# Patient Record
Sex: Male | Born: 1995 | Race: White | Hispanic: No | Marital: Single | Smoking: Never smoker
Health system: Southern US, Community
[De-identification: ages and names within clinical notes are randomized; demographics above are authoritative.]

## PROBLEM LIST (undated history)

## (undated) DIAGNOSIS — F419 Anxiety disorder, unspecified: Secondary | ICD-10-CM

---

## 2015-11-22 ENCOUNTER — Encounter: Payer: Self-pay | Admitting: Emergency Medicine

## 2015-11-22 ENCOUNTER — Emergency Department: Payer: PRIVATE HEALTH INSURANCE

## 2015-11-22 ENCOUNTER — Emergency Department
Admission: EM | Admit: 2015-11-22 | Discharge: 2015-11-22 | Disposition: A | Discharge status: Home / Self Care | Payer: PRIVATE HEALTH INSURANCE | Attending: Emergency Medicine | Admitting: Emergency Medicine

## 2015-11-22 DIAGNOSIS — J181 Lobar pneumonia, unspecified organism: Secondary | ICD-10-CM

## 2015-11-22 DIAGNOSIS — J189 Pneumonia, unspecified organism: Secondary | ICD-10-CM | POA: Diagnosis not present

## 2015-11-22 DIAGNOSIS — R05 Cough: Secondary | ICD-10-CM | POA: Diagnosis present

## 2015-11-22 MED ORDER — HYDROCOD POLST-CPM POLST ER 10-8 MG/5ML PO SUER: 5.0000 mL | Freq: Two times a day (BID) | ORAL | Status: AC | PRN

## 2015-11-22 MED ORDER — ALBUTEROL SULFATE HFA 108 (90 BASE) MCG/ACT IN AERS: 2.0000 | INHALATION_SPRAY | Freq: Four times a day (QID) | RESPIRATORY_TRACT | Status: AC | PRN

## 2015-11-22 MED ORDER — IBUPROFEN 600 MG PO TABS
600.0000 mg | ORAL_TABLET | Freq: Once | ORAL | Status: AC
  Administered 2015-11-22: 600 mg via ORAL
  Filled 2015-11-22: qty 1

## 2015-11-22 MED ORDER — AZITHROMYCIN 250 MG PO TABS: ORAL_TABLET | ORAL | Status: AC

## 2015-11-22 MED ORDER — AZITHROMYCIN 250 MG PO TABS
500.0000 mg | ORAL_TABLET | Freq: Once | ORAL | Status: AC
  Administered 2015-11-22: 500 mg via ORAL
  Filled 2015-11-22: qty 2

## 2015-11-22 MED ORDER — IPRATROPIUM-ALBUTEROL 0.5-2.5 (3) MG/3ML IN SOLN
3.0000 mL | Freq: Once | RESPIRATORY_TRACT | Status: AC
  Administered 2015-11-22: 3 mL via RESPIRATORY_TRACT
  Filled 2015-11-22: qty 3

## 2015-11-22 MED ORDER — BENZONATATE 100 MG PO CAPS
100.0000 mg | ORAL_CAPSULE | Freq: Once | ORAL | Status: AC
  Administered 2015-11-22: 100 mg via ORAL
  Filled 2015-11-22: qty 1

## 2015-11-22 NOTE — ED Provider Notes (Signed)
[facility] Emergency Department Provider Note  ____________________________________________  Time seen: Approximately 0032 AM  I have reviewed the triage vital signs and the nursing notes.   HISTORY  Chief Complaint Cough    HPI Dale BordersJacob Bartram Smith is a 19 y.o. male who comes in today with a cough. The patient reports that he has been sick since last Sunday with a viral infection. He reports that he's been coughing really bad in tonight he was seeing stars and feeling like he was going to black out from the coughing. The patient reports that it was really scary. He has had decreased appetite and vomited twice due to coughing. The patient has been drinking water but has not had much of an appetite. The patient saw his primary care physician 6 days ago and was told that it was just a viral illness. He had a flu test which was negative. He has been taking DayQuil, NyQuil and Robitussin but has not been helping. The patient was concerned so he decided to come in for evaluation. The patient reports he's had fevers with the highest being 102.9 5 days ago. He reports that today he feels flushed. He has been drinkingwater to stay hydrated. The patient was concerned due to how severe he was coughing so he decided to come in for evaluation.   History reviewed. No pertinent past medical history.  There are no active problems to display for this patient.   History reviewed. No pertinent past surgical history.  No current outpatient prescriptions on file.  Allergies Review of patient's allergies indicates no known allergies.  No family history on file.  Social History Social History  Substance Use Topics  . Smoking status: Never Smoker   . Smokeless tobacco: None  . Alcohol Use: No    Review of Systems Constitutional:  fever/chills Eyes: No visual changes. ENT: No sore throat. Cardiovascular: Denies chest pain. Respiratory: Cough Gastrointestinal: No  abdominal pain.  No nausea, no vomiting.  No diarrhea.  No constipation. Genitourinary: Negative for dysuria. Musculoskeletal: Negative for back pain. Skin: Negative for rash. Neurological: Negative for headaches, focal weakness or numbness.  10-point ROS otherwise negative.  ____________________________________________   PHYSICAL EXAM:  VITAL SIGNS: ED Triage Vitals  Enc Vitals Group     BP 11/22/15 0019 134/67 mmHg     Pulse Rate 11/22/15 0019 110     Resp 11/22/15 0019 18     Temp 11/22/15 0019 99.1 F (37.3 C)     Temp Source 11/22/15 0019 Oral     SpO2 11/22/15 0019 97 %     Weight 11/22/15 0019 175 lb (79.379 kg)     Height 11/22/15 0019 6' (1.829 m)     Head Cir --      Peak Flow --      Pain Score 11/22/15 0035 0     Pain Loc --      Pain Edu? --      Excl. in GC? --     Constitutional: Alert and oriented. Well appearing and in mild distress. Eyes: Conjunctivae are normal. PERRL. EOMI. Head: Atraumatic. Nose: No congestion/rhinnorhea. Mouth/Throat: Mucous membranes are moist.  Oropharynx non-erythematous. Cardiovascular: Normal rate, regular rhythm. Grossly normal heart sounds.  Good peripheral circulation. Respiratory: Normal respiratory effort.  No retractions. Lungs CTAB. Gastrointestinal: Soft and nontender. No distention.  Musculoskeletal: No lower extremity tenderness nor edema.   Neurologic:  Normal speech and language.  Skin:  Skin is warm, dry and intact.  Psychiatric:  Mood and affect are normal.   ____________________________________________   LABS (all labs ordered are listed, but only abnormal results are displayed)  Labs Reviewed - No data to display ____________________________________________  EKG  none ____________________________________________  RADIOLOGY  CXR: Left upper lobe pneumonia ____________________________________________   PROCEDURES  Procedure(s) performed: None  Critical Care performed:  No  ____________________________________________   INITIAL IMPRESSION / ASSESSMENT AND PLAN / ED COURSE  Pertinent labs & imaging results that were available during my care of the patient were reviewed by me and considered in my medical decision making (see chart for details).  This is a 19 year old male who comes in with coughing and fever for over a week. He reports that he has been severely coughing and is concerned due to the severity. The patient had some clear but coarse breath sounds elected give him a DuoNeb to see if that would help his coughing. The patient some benzonatate for his symptoms. It appears though that the patient does have a pneumonia. He is not febrile here and he is well appearing in no acute distress. I do not feel the patient needs any blood work at this time but I will treat him with azithromycin for his pneumonia. 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 I have explained return precautions and the patient seems to understand reasons to return to the emergency department. ____________________________________________   FINAL CLINICAL IMPRESSION(S) / ED DIAGNOSES  Final diagnoses:  Left upper lobe pneumonia      Rebecka Apley, MD 11/22/15 [phone]

## 2015-11-22 NOTE — ED Notes (Addendum)
Patient ambulatory to triage with steady gait, without difficulty or distress noted; pt reports since Sunday having prod cough yellow sputum; reports being seen by his PCP over Thanksgiving break and had negative flu swab

## 2015-11-22 NOTE — Discharge Instructions (Signed)

## 2016-11-27 ENCOUNTER — Encounter: Payer: Self-pay | Admitting: Emergency Medicine

## 2016-11-27 ENCOUNTER — Emergency Department
Admission: EM | Admit: 2016-11-27 | Discharge: 2016-11-27 | Disposition: A | Discharge status: Home / Self Care | Payer: PRIVATE HEALTH INSURANCE | Attending: Emergency Medicine | Admitting: Emergency Medicine

## 2016-11-27 DIAGNOSIS — Z79899 Other long term (current) drug therapy: Secondary | ICD-10-CM | POA: Insufficient documentation

## 2016-11-27 DIAGNOSIS — F419 Anxiety disorder, unspecified: Secondary | ICD-10-CM | POA: Diagnosis not present

## 2016-11-27 DIAGNOSIS — R202 Paresthesia of skin: Secondary | ICD-10-CM

## 2016-11-27 DIAGNOSIS — J340 Abscess, furuncle and carbuncle of nose: Secondary | ICD-10-CM | POA: Insufficient documentation

## 2016-11-27 DIAGNOSIS — L0291 Cutaneous abscess, unspecified: Secondary | ICD-10-CM

## 2016-11-27 HISTORY — DX: Anxiety disorder, unspecified: F41.9

## 2016-11-27 LAB — BASIC METABOLIC PANEL
Anion gap: 7 (ref 5–15)
BUN: 15 mg/dL (ref 6–20)
CALCIUM: 9.8 mg/dL (ref 8.9–10.3)
CO2: 27 mmol/L (ref 22–32)
CREATININE: 1.12 mg/dL (ref 0.61–1.24)
Chloride: 105 mmol/L (ref 101–111)
Glucose, Bld: 113 mg/dL — ABNORMAL HIGH (ref 65–99)
Potassium: 4 mmol/L (ref 3.5–5.1)
SODIUM: 139 mmol/L (ref 135–145)

## 2016-11-27 LAB — URINALYSIS, COMPLETE (UACMP) WITH MICROSCOPIC
BILIRUBIN URINE: NEGATIVE
GLUCOSE, UA: NEGATIVE mg/dL
HGB URINE DIPSTICK: NEGATIVE
Ketones, ur: NEGATIVE mg/dL
Leukocytes, UA: NEGATIVE
NITRITE: NEGATIVE
PROTEIN: NEGATIVE mg/dL
SPECIFIC GRAVITY, URINE: 1.025 (ref 1.005–1.030)
pH: 7.5 (ref 5.0–8.0)

## 2016-11-27 LAB — CBC
HCT: 43.8 % (ref 40.0–52.0)
HEMOGLOBIN: 15.4 g/dL (ref 13.0–18.0)
MCH: 31.4 pg (ref 26.0–34.0)
MCHC: 35 g/dL (ref 32.0–36.0)
MCV: 89.7 fL (ref 80.0–100.0)
PLATELETS: 195 10*3/uL (ref 150–440)
RBC: 4.88 MIL/uL (ref 4.40–5.90)
RDW: 12.7 % (ref 11.5–14.5)
WBC: 5.4 10*3/uL (ref 3.8–10.6)

## 2016-11-27 MED ORDER — LORAZEPAM 0.5 MG PO TABS: 0.5000 mg | ORAL_TABLET | Freq: Three times a day (TID) | ORAL | 0 refills | Status: AC | PRN

## 2016-11-27 NOTE — Discharge Instructions (Signed)
As we have discussed your workup today has shown very normal results. Please continue your antibiotic for its entire prescribe course. You'll be prescribed an anxiety medication called lorazepam. As we discussed this medication should only be taken when needed. Do not drink alcohol or drive fall taking this medication. Please follow-up with your primary care doctor/student health in approximately 3-5 days for recheck/reevaluation. Return to the emergency department for any fever or any other personally concerning symptoms.

## 2016-11-27 NOTE — ED Provider Notes (Signed)
[facility] Emergency Department Provider Note  Time seen: 2:32 PM  I have reviewed the triage vital signs and the nursing notes.   HISTORY  Chief Complaint Headache    HPI Dale Smith is a 20 y.o. male with a past medical history of anxiety who presents the emergency department for a nasal abscess and facial tingling. According to the patient approximately 10 days ago he was diagnosed with a nasal abscess by his primary care doctor prescribed Keflex. Patient states he lost his Keflex when traveling for Thanksgiving break. He went to an urgent care several days ago and was prescribed Bactrim. States he has been taking Bactrim but he has noted over last several days intermittent nausea, denies vomiting. Denies fever. Patient admits to significant history of anxiety. States today he was feeling very anxious and began feeling a tingling sensation in his hands and in his left face which concerned him. Denies any focal weakness. States he tried to calm himself down but the facial tingling did not resolve though he came to the emergency department for evaluation.  Past Medical History:  Diagnosis Date  . Anxiety     There are no active problems to display for this patient.   History reviewed. No pertinent surgical history.  Prior to Admission medications   Medication Sig Start Date End Date Taking? Authorizing Provider  albuterol (PROVENTIL HFA;VENTOLIN HFA) 108 (90 BASE) MCG/ACT inhaler Inhale 2 puffs into the lungs every 6 (six) hours as needed. 11/22/15   Rebecka ApleyAllison P Webster, MD  chlorpheniramine-HYDROcodone [facility](TUSSIONEX PENNKINETIC ER) 10-8 MG/5ML SUER Take 5 mLs by mouth every 12 (twelve) hours as needed for cough. 11/22/15   Rebecka ApleyAllison P Webster, MD    No Known Allergies  History reviewed. No pertinent family history.  Social History Social History  Substance Use Topics  . Smoking status: Never Smoker  . Smokeless tobacco: Never Used  . Alcohol use  Yes    Review of Systems Constitutional: Negative for fever. ENT: Right-sided nasal abscess. Cardiovascular: Negative for chest pain. Respiratory: Negative for shortness of breath. Gastrointestinal: Negative for abdominal pain Neurological: Mild headache, left facial tingling. 10-point ROS otherwise negative.  ____________________________________________   PHYSICAL EXAM:  VITAL SIGNS: ED Triage Vitals  Enc Vitals Group     BP 11/27/16 1200 140/81     Pulse Rate 11/27/16 1200 (!) 102     Resp 11/27/16 1200 20     Temp 11/27/16 1200 98.2 F (36.8 C)     Temp Source 11/27/16 1200 Oral     SpO2 11/27/16 1200 100 %     Weight 11/27/16 1201 175 lb (79.4 kg)     Height 11/27/16 1201 6' (1.829 m)     Head Circumference --      Peak Flow --      Pain Score 11/27/16 1201 2     Pain Loc --      Pain Edu? --      Excl. in GC? --     Constitutional: Alert and oriented. Well appearing and in no distress. Eyes: Normal exam, EOMI, PERRL ENT   Head: Normocephalic and atraumatic.Patient does have a small pimple/abscess to his right nose/nostril. Very small in size. No apparent spread, able to transilluminate the entire area.   Mouth/Throat: Mucous membranes are moist. Cardiovascular: Normal rate, regular rhythm. No murmur Respiratory: Normal respiratory effort without tachypnea nor retractions. Breath sounds are clear  Gastrointestinal: Soft and nontender. No distention.   Musculoskeletal: Nontender with normal  range of motion in all extremities.  Neurologic:  Normal speech and language. No gross focal neurologic deficits. Cranial nerves intact though with sensation the patient does express a tingling sensation to left face. Motor is normal. No extremity deficits. Speech is normal and clear. Skin:  Skin is warm, dry and intact.  Psychiatric: Patient very anxious during examination.  ____________________________________________    EKG  EKG reviewed and interpreted by myself  shows normal sinus rhythm at 83 bpm, narrow QRS, normal axis, normal intervals, no concerning ST changes.  ____________________________________________    INITIAL IMPRESSION / ASSESSMENT AND PLAN / ED COURSE  Pertinent labs & imaging results that were available during my care of the patient were reviewed by me and considered in my medical decision making (see chart for details).  Patient presents the emergency department with concerns of a right nose abscess, and facial tingling. Patient admits earlier today being very anxious, hyperventilating, tingling in his hands and left face. States the tingling in his hands has resolved but he will continue to have tingling in the left face. Neurological exam is completely normal besides the tingling sensation in the left face. Motor is intact. Believe the tingling is likely due to paresthesias as a result of anxiety. No concern for CVA this time. The nasal abscess/pimple is extremely small, patient is on an appropriate antibiotic and I expect resolution within the next several days.  ____________________________________________   FINAL CLINICAL IMPRESSION(S) / ED DIAGNOSES  Nasal abscess Anxiety    Minna AntisKevin Zhane Bluitt, MD 11/27/16 [phone]

## 2016-11-27 NOTE — ED Triage Notes (Addendum)
Pt to ed with c/o headache and left sided head pressure since yesterday.  Pt also reports nasal abscess to right side of nose, area is mildly red. Hx of anxiety.  Pt also reports "I have not felt like myself for the past couple of days I have felt dizzy"

## 2016-11-28 LAB — URINE CULTURE: Culture: NO GROWTH

## 2017-02-08 ENCOUNTER — Emergency Department: Payer: PRIVATE HEALTH INSURANCE

## 2017-02-08 ENCOUNTER — Encounter: Payer: Self-pay | Admitting: Emergency Medicine

## 2017-02-08 ENCOUNTER — Emergency Department
Admission: EM | Admit: 2017-02-08 | Discharge: 2017-02-08 | Disposition: A | Discharge status: Home / Self Care | Payer: PRIVATE HEALTH INSURANCE | Attending: Emergency Medicine | Admitting: Emergency Medicine

## 2017-02-08 DIAGNOSIS — N50811 Right testicular pain: Secondary | ICD-10-CM

## 2017-02-08 DIAGNOSIS — Z79899 Other long term (current) drug therapy: Secondary | ICD-10-CM | POA: Insufficient documentation

## 2017-02-08 DIAGNOSIS — N452 Orchitis: Secondary | ICD-10-CM | POA: Insufficient documentation

## 2017-02-08 MED ORDER — IBUPROFEN 800 MG PO TABS: 800.0000 mg | ORAL_TABLET | Freq: Three times a day (TID) | ORAL | 0 refills | Status: AC | PRN

## 2017-02-08 MED ORDER — AZITHROMYCIN 250 MG PO TABS
1000.0000 mg | ORAL_TABLET | Freq: Once | ORAL | Status: AC
  Administered 2017-02-08: 1000 mg via ORAL
  Filled 2017-02-08: qty 4

## 2017-02-08 MED ORDER — CEFTRIAXONE SODIUM 250 MG IJ SOLR
250.0000 mg | INTRAMUSCULAR | Status: DC
  Administered 2017-02-08: 250 mg via INTRAMUSCULAR
  Filled 2017-02-08: qty 250

## 2017-02-08 MED ORDER — LIDOCAINE HCL (PF) 1 % IJ SOLN
0.9000 mL | Freq: Once | INTRAMUSCULAR | Status: AC
  Administered 2017-02-08: 0.9 mL

## 2017-02-08 MED ORDER — LIDOCAINE HCL (PF) 1 % IJ SOLN: 5.0000 mL | Freq: Once | INTRAMUSCULAR | Status: DC

## 2017-02-08 MED ORDER — LIDOCAINE HCL (PF) 1 % IJ SOLN
INTRAMUSCULAR | Status: AC
  Administered 2017-02-08: 0.9 mL
  Filled 2017-02-08: qty 5

## 2017-02-08 NOTE — ED Triage Notes (Signed)
Patient presents to the ED with right sided testicular pain that began last night.  Patient states area is very swollen and has become much more painful.  [facility] and told he likely had epididymitis but to come to the ED if pain increased. Patient appears uncomfortable during triage.

## 2017-02-08 NOTE — ED Provider Notes (Signed)
[facility] Emergency Department Provider Note        Time seen: ----------------------------------------- 4:31 PM on 02/08/2017 -----------------------------------------    I have reviewed the triage vital signs and the nursing notes.   HISTORY  Chief Complaint Testicle Pain    HPI Dale Smith is a 21 y.o. male who presents to the ER for right-sided testicular pain that began last night. Patient states the area is very swollen and has become much more painful. [facility] was seen at [facility] and was told he likely had epididymitis but to come to the ER if pain increased. Pain onset was around 1:30 in the morning.   Past Medical History:  Diagnosis Date  . Anxiety   . Anxiety     There are no active problems to display for this patient.   History reviewed. No pertinent surgical history.  Allergies Patient has no known allergies.  Social History Social History  Substance Use Topics  . Smoking status: Never Smoker  . Smokeless tobacco: Never Used  . Alcohol use Yes    Review of Systems Constitutional: Negative for fever. Genitourinary: Positive for right testicular pain Skin: Positive for scrotal erythema Neurological: Negative for headaches, focal weakness or numbness.  10-point ROS otherwise negative.  ____________________________________________   PHYSICAL EXAM:  VITAL SIGNS: ED Triage Vitals  Enc Vitals Group     BP 02/08/17 1541 114/80     Pulse Rate 02/08/17 1541 (!) 58     Resp 02/08/17 1541 18     Temp 02/08/17 1541 98.1 F (36.7 C)     Temp Source 02/08/17 1541 Oral     SpO2 02/08/17 1541 100 %     Weight 02/08/17 1518 174 lb (78.9 kg)     Height 02/08/17 1518 6' (1.829 m)     Head Circumference --      Peak Flow --      Pain Score 02/08/17 1542 7     Pain Loc --      Pain Edu? --      Excl. in GC? --     Constitutional: Alert and oriented. Well appearing and in no distress. Eyes: Conjunctivae are normal.  Normal extraocular movements. Musculoskeletal: Nontender with normal range of motion in all extremities. No lower extremity tenderness nor edema. Genitourinary: Right testicular tenderness and mild enlargement, mild right hemiscrotum erythema. No hernia is appreciated. Left testicle is normal Skin:  Mild right hemiscrotal erythema ____________________________________________  ED COURSE:  Pertinent labs & imaging results that were available during my care of the patient were reviewed by me and considered in my medical decision making (see chart for details). Patient presents to the ER in no distress with testicular pain. We will assess with ultrasound reevaluate.   Procedures ____________________________________________   RADIOLOGY  IMPRESSION: 1. No acute findings seen to explain the patient's symptoms. 2. 0.9 cm right epididymal head cyst incidentally noted. 3. Small left-sided varicocele noted.  ____________________________________________  FINAL ASSESSMENT AND PLAN  Orchitis  Plan: Patient with imaging as dictated above. Ultrasound was negative for torsion. Symptoms are likely coming from orchitis. He does report recent unprotected sex. He has deferred STD testing at this time. We will cover with Rocephin and Zithromax. He was given up her prescription for an oral [facility] at [facility]. He is stable for discharge.   Emily Filbert, MD   Note: This note was generated in part or whole with voice recognition software. Voice recognition is usually quite accurate  but there are transcription errors that can and very often do occur. I apologize for any typographical errors that were not detected and corrected.     Emily FilbertJonathan E Williams, MD 02/08/17 [phone]

## 2017-02-08 NOTE — ED Notes (Signed)
Patient states that he started having pain in his right testicle this morning around 0130. Patient states that he was seen at [facility] and was told to come to the ER if pain got worse. Patient state that the pain has gradually gotten worse over the past few hours. Patient reports some swelling and redness to the area that extends into his groin.

## 2017-06-22 IMAGING — US US SCROTUM
1 series · 14 of 25 positions shown · non-contrast
Comparison: None.

CLINICAL DATA: Acute onset of right-sided testicular pain. Initial
encounter.

EXAM:
SCROTAL ULTRASOUND
DOPPLER ULTRASOUND OF THE TESTICLES
TECHNIQUE: Complete ultrasound examination of the testicles, epididymis, and
other scrotal structures was performed. Color and spectral Doppler
ultrasound were also utilized to evaluate blood flow to the
testicles.

[Series 1: us scrotum · 0.08mm/px · 14 of 50 slices shown]
[im 1/50]
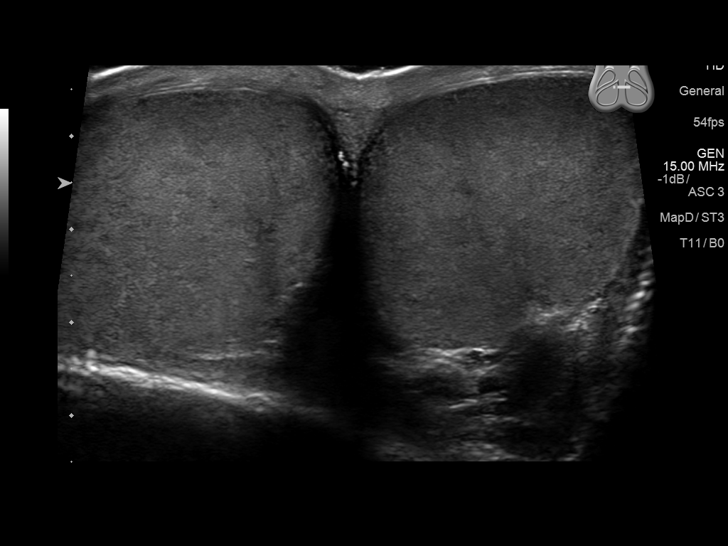
[im 5/50]
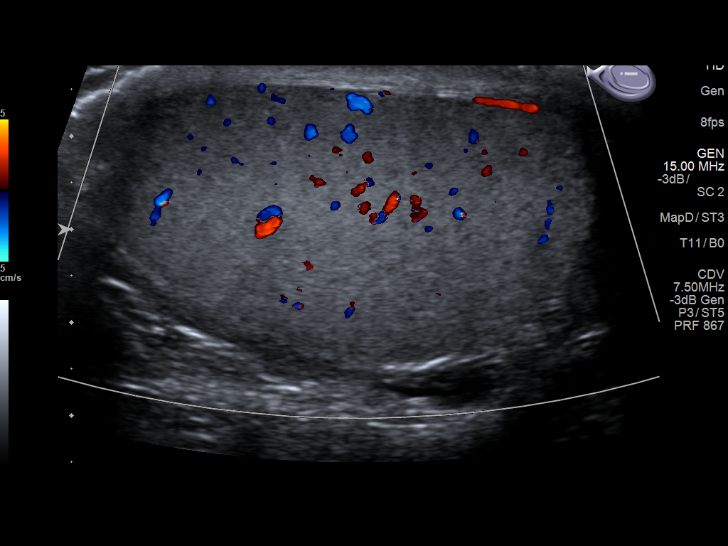
[im 9/50]
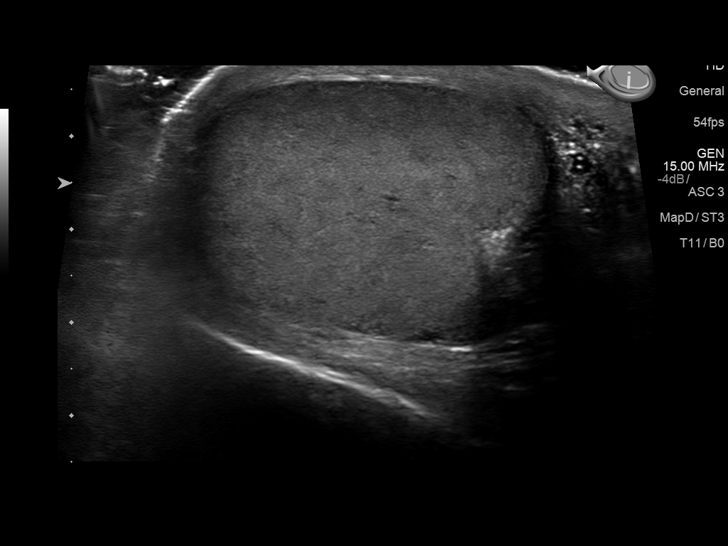
[im 13/50]
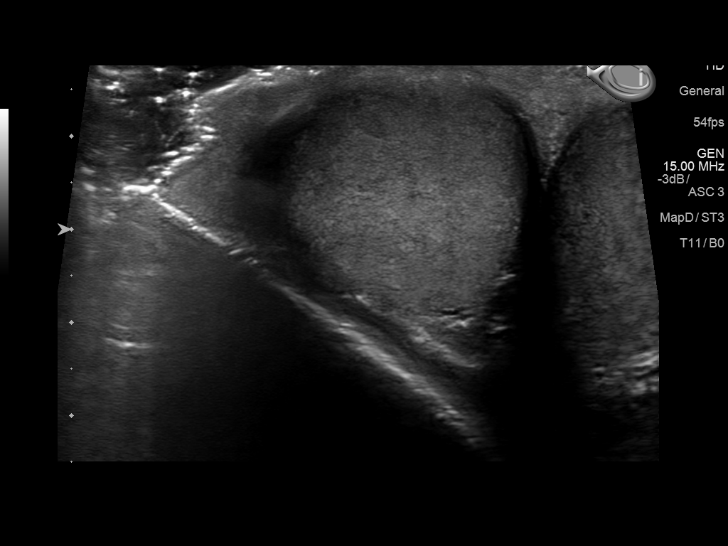
[im 17/50]
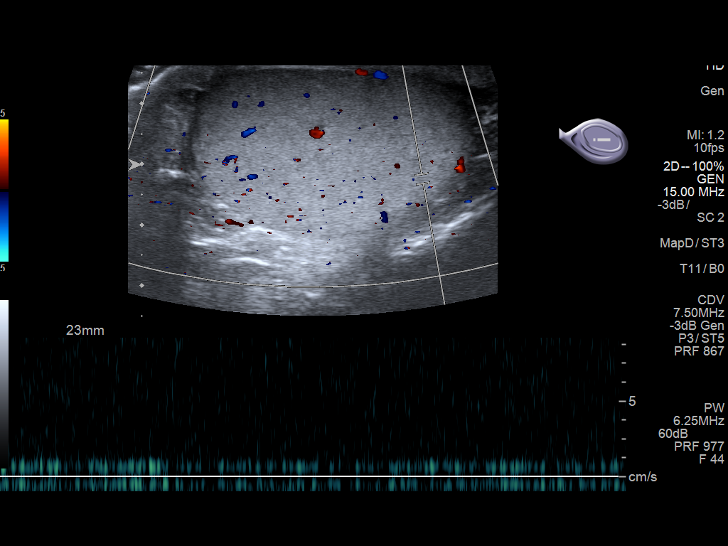
[im 19/50]
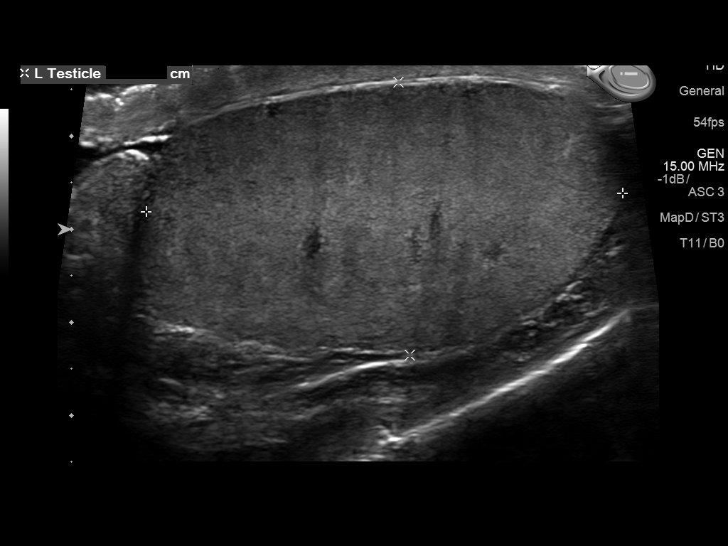
[im 23/50]
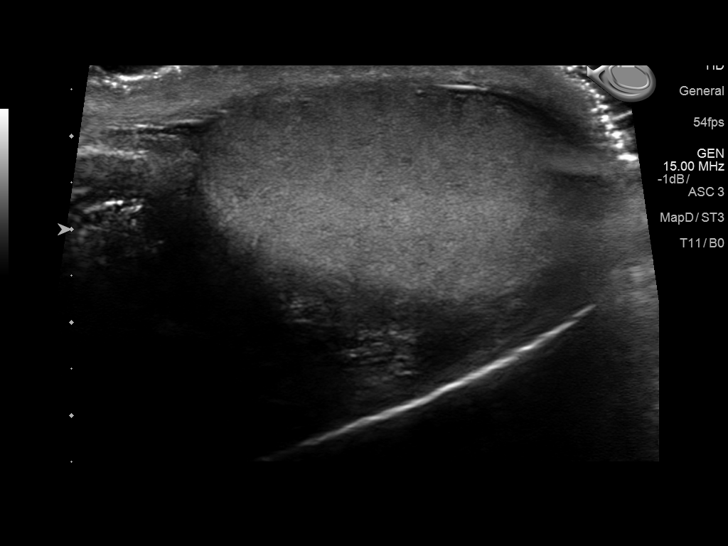
[im 27/50]
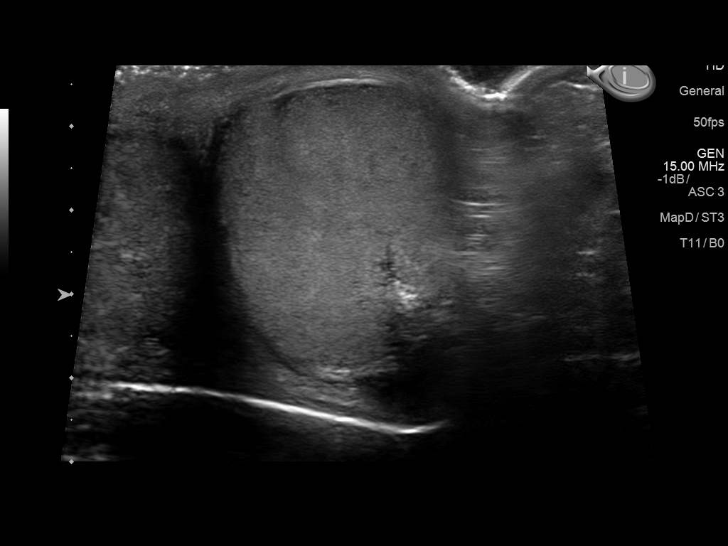
[im 31/50]
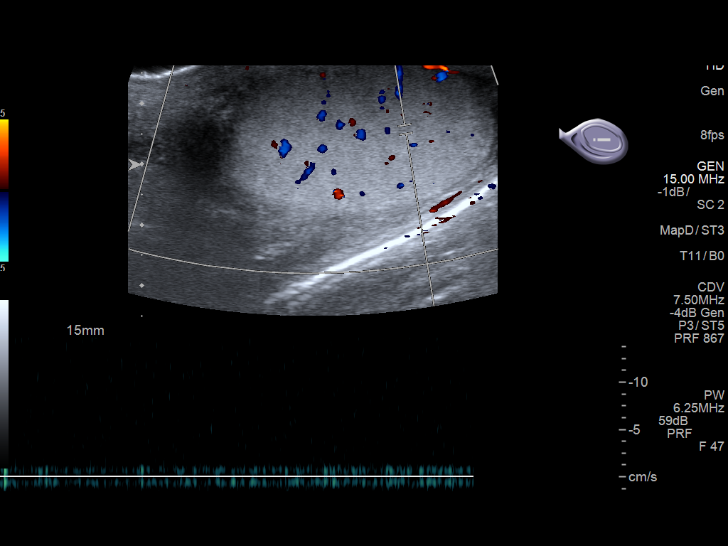
[im 33/50]
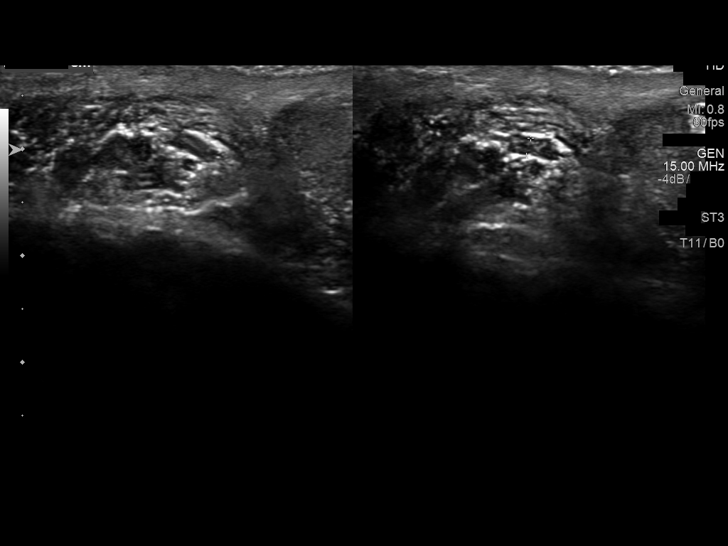
[im 37/50]
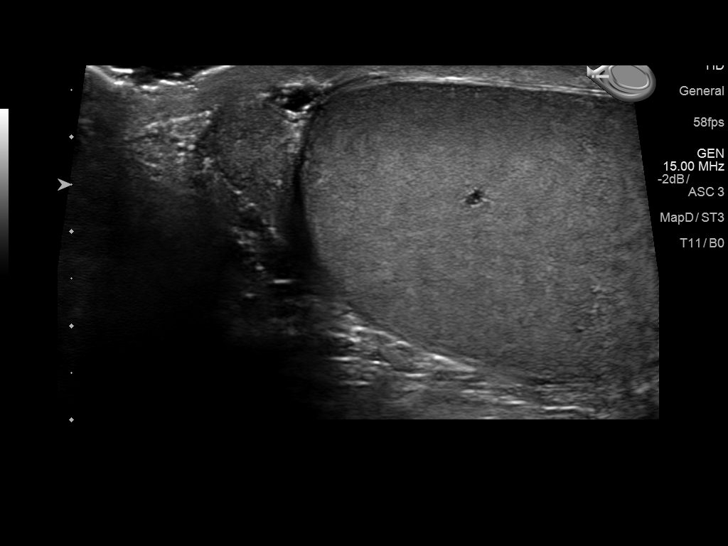
[im 41/50]
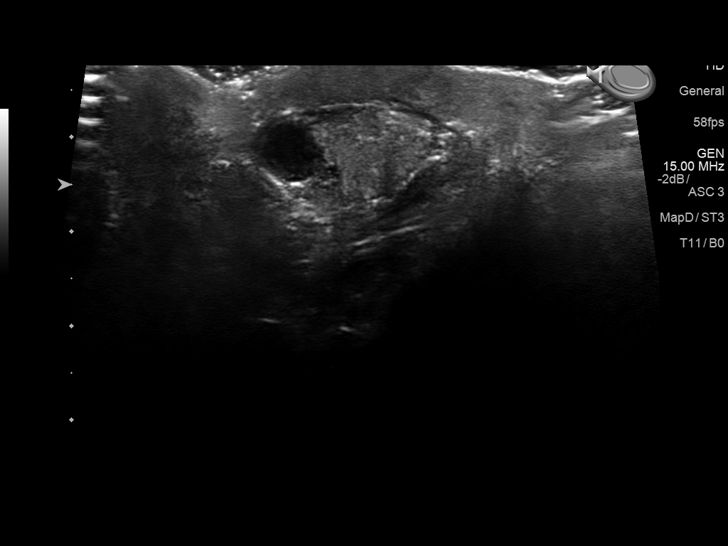
[im 45/50]
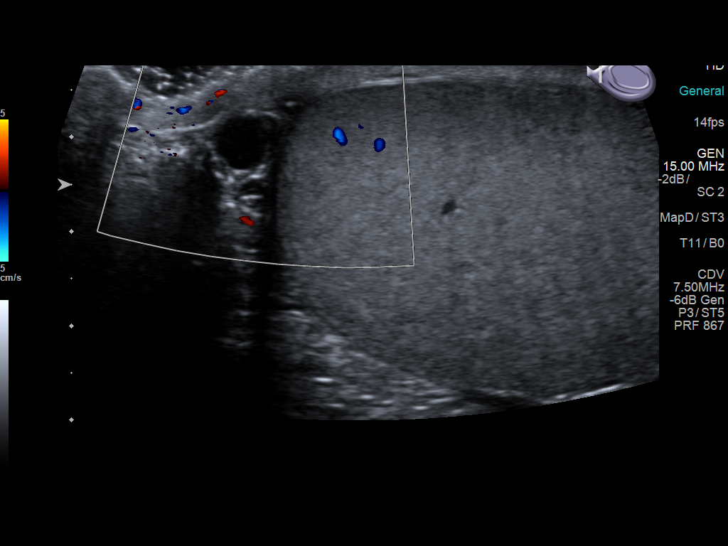
[im 50/50]
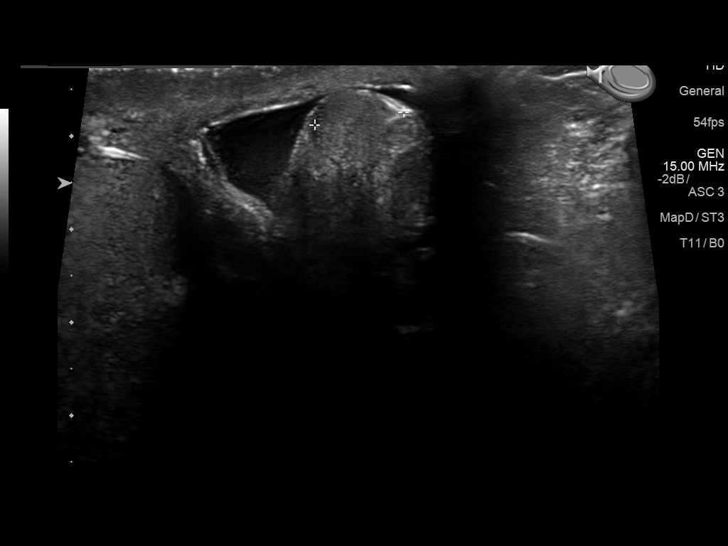

[14 of 25 positions shown; findings below may reference images not displayed]

FINDINGS: Right testicle

Measurements: 5.2 x 2.9 x 3.4 cm. No mass or microlithiasis
visualized.

Left testicle

Measurements: 5.1 x 2.9 x 3.0 cm. No mass or microlithiasis
visualized.

Right epididymis: A 0.9 cm simple appearing cyst is noted at the
right epididymal head.

Left epididymis:  Normal in size and appearance.

Hydrocele:  None visualized.

Varicocele: A small left-sided varicocele is noted, measuring
cm.

Pulsed Doppler interrogation of both testes demonstrates normal low
resistance arterial and venous waveforms bilaterally.
IMPRESSION: 1. No acute findings seen to explain the patient's symptoms.
2. 0.9 cm right epididymal head cyst incidentally noted.
3. Small left-sided varicocele noted.

## 2020-02-09 MED ORDER — OMEPRAZOLE 20 MG CAPSULE,DELAYED RELEASE
20 | ORAL | 2.00 refills | 90.00000 days | Status: AC
Start: 2020-02-09 — End: 2020-08-23

## 2020-03-23 ENCOUNTER — Inpatient Hospital Stay: Admit: 2020-03-23 | Discharge: 2020-03-23 | Payer: PRIVATE HEALTH INSURANCE | Primary: Internal Medicine

## 2020-03-23 DIAGNOSIS — Z20828 Contact with and (suspected) exposure to other viral communicable diseases: Secondary | ICD-10-CM

## 2020-03-23 DIAGNOSIS — Z20822 Contact with and (suspected) exposure to covid-19: Secondary | ICD-10-CM

## 2020-03-24 LAB — SARS COV-2 (COVID-19) RNA: BKR SARS-COV-2 RNA (COVID-19) (YH): NOT DETECTED

## 2020-03-28 ENCOUNTER — Inpatient Hospital Stay: Admit: 2020-03-28 | Discharge: 2020-03-28 | Payer: PRIVATE HEALTH INSURANCE | Primary: Internal Medicine

## 2020-03-28 DIAGNOSIS — Z20828 Contact with and (suspected) exposure to other viral communicable diseases: Secondary | ICD-10-CM

## 2020-03-29 DIAGNOSIS — Z20822 Contact with and (suspected) exposure to covid-19: Secondary | ICD-10-CM

## 2020-03-29 LAB — SARS COV-2 (COVID-19) RNA: BKR SARS-COV-2 RNA (COVID-19) (YH): NOT DETECTED

## 2020-04-04 ENCOUNTER — Inpatient Hospital Stay: Admit: 2020-04-04 | Discharge: 2020-04-04 | Payer: PRIVATE HEALTH INSURANCE | Primary: Internal Medicine

## 2020-04-04 DIAGNOSIS — Z20822 Contact with and (suspected) exposure to covid-19: Secondary | ICD-10-CM

## 2020-04-04 DIAGNOSIS — Z20828 Contact with and (suspected) exposure to other viral communicable diseases: Secondary | ICD-10-CM

## 2020-04-05 LAB — SARS COV-2 (COVID-19) RNA: BKR SARS-COV-2 RNA (COVID-19) (YH): NOT DETECTED

## 2020-04-08 ENCOUNTER — Ambulatory Visit
Admit: 2020-04-08 | Payer: PRIVATE HEALTH INSURANCE | Attending: Student in an Organized Health Care Education/Training Program | Primary: Internal Medicine

## 2020-04-09 DIAGNOSIS — Z23 Encounter for immunization: Secondary | ICD-10-CM

## 2020-05-05 ENCOUNTER — Telehealth: Admit: 2020-05-05 | Payer: PRIVATE HEALTH INSURANCE | Attending: Advanced Practice Nursing | Primary: Internal Medicine

## 2020-05-05 DIAGNOSIS — Z8249 Family history of ischemic heart disease and other diseases of the circulatory system: Secondary | ICD-10-CM

## 2020-05-05 NOTE — Progress Notes
Patient is son of another patient of Dr. 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 Following the results of the genetic testing we will proceed with the workup (CTA, Echo) to assess for aortic aneurysm. Following genetic testing results, echo, and CTA we will see the patient with Dr. Vanita Panda.  Macenzie Burford A. Nelma Phagan, MSN, AGACNP-BCNurse Practitioner for Dr. Sanda Klein and Dr. Carlean [facility] [phone] [facility] [address] [place], [place] [id number]: [phone]: [phone] [email or web address]

## 2020-05-06 ENCOUNTER — Ambulatory Visit: Admit: 2020-05-06 | Payer: PRIVATE HEALTH INSURANCE | Primary: Internal Medicine

## 2020-05-06 ENCOUNTER — Inpatient Hospital Stay: Admit: 2020-05-06 | Discharge: 2020-05-06 | Payer: PRIVATE HEALTH INSURANCE | Primary: Internal Medicine

## 2020-05-06 DIAGNOSIS — Z8249 Family history of ischemic heart disease and other diseases of the circulatory system: Secondary | ICD-10-CM

## 2020-05-07 DIAGNOSIS — Z23 Encounter for immunization: Secondary | ICD-10-CM

## 2020-06-01 ENCOUNTER — Inpatient Hospital Stay: Admit: 2020-06-01 | Discharge: 2020-06-01 | Payer: BLUE CROSS/BLUE SHIELD | Primary: Internal Medicine

## 2020-06-01 DIAGNOSIS — R0789 Other chest pain: Secondary | ICD-10-CM

## 2020-06-01 DIAGNOSIS — R002 Palpitations: Secondary | ICD-10-CM

## 2020-06-15 ENCOUNTER — Encounter: Admit: 2020-06-15 | Payer: PRIVATE HEALTH INSURANCE | Primary: Internal Medicine

## 2020-07-12 ENCOUNTER — Telehealth: Admit: 2020-07-12 | Payer: PRIVATE HEALTH INSURANCE | Attending: Cardiothoracic Surgery | Primary: Internal Medicine

## 2020-07-12 NOTE — Telephone Encounter
Patient called to follow up on the results of his genetic testing. I communicated the results were still processing and can take 2-3 months and our office will reach out to him once there results are in.

## 2020-07-15 LAB — SINGLE GENE ANALYSIS AND LIMITED GENE PANEL (DNA LAB) (YMG)

## 2020-08-01 ENCOUNTER — Ambulatory Visit: Admit: 2020-08-01 | Payer: PRIVATE HEALTH INSURANCE | Attending: Advanced Practice Nursing | Primary: Internal Medicine

## 2020-08-01 DIAGNOSIS — Q2543 Congenital aneurysm of aorta: Secondary | ICD-10-CM

## 2020-08-01 NOTE — Progress Notes
TELEPHONE VISIT: For this visit the clinician and patient were present via telephone (audio only).Patient counseled on available options for visit type; Patient elected telephone visit; Patient consent given for telephone visit: YesPatient Identity was confirmed during this call.  Other individuals actively participating in the telephone encounter and their name/relation to the patient: noneTotal time spent in medical telephone consultation: 14 minutesBecause this visit was completed over telephone, a hands-on physical exam was not performed.  Patient understands and knows to call back if condition changes. The visit type for this patient required modifications due to the COVID-19 outbreak.	 [facility] CHART COPY DO NOT DISCARDReferring Physician: Salomon Fick., [address] ,  [place] [id number] Complaint:Routine Aortic and Valvular SurveillanceSUBJECTIVE: History of Present Illness:I had the pleasure of seeing Andrew Moody in the clinic today on 08/01/20 .  Andrew Moody is a 24 y.o. male who presents to discuss genetic testing resultsJacob Moody is the son of a patient who had positive genetic testing after undergoing an aortic aneurysm replacement with Dr. Vanita Panda.Today, the patient is in his usual state of health with no complaints. No diagnosis found.Problem List:Past Medical History: Diagnosis Date ? ITP (idiopathic thrombocytopenic purpura)   2011 Past Surgical History: Procedure Laterality Date ? KNEE ARTHROSCOPY   Medications:Current Outpatient Medications Medication Sig Dispense Refill ? acetaminophen-codeine (TYLENOL #3) 300-30 mg per tablet Take 1 tablet by mouth every 6 (six) hours as needed. for pain  0 ? amoxicillin-clavulanate (AUGMENTIN) 875-125 mg per tablet Take 1 tablet by mouth every 12 (twelve) hours. Day 7/10 for right foot wound infection   ? cetirizine (ZYRTEC) 10 MG tablet Take 10 mg by mouth daily.   ? EPINEPHrine (EPI-PEN) 0.3 mg/0.3 mL (1:1,000) Use as directed. Please dispense 2 pack. NDC 28413-244-01 1 Device 2 ? escitalopram oxalate (LEXAPRO) 10 mg tablet Take 10 mg by mouth every morning.   ? mometasone (NASONEX) 50 mcg/actuation nasal spray 2 sprays by Nasal route daily. 17 g 5 ? olopatadine (PATADAY) 0.2 % Drop Place 1 drop into both eyes daily. 2.5 mL 5 ? omeprazole (PRILOSEC) 20 mg capsule TAKE 1 CAPSULE BY MOUTH EVERY DAY   ? PARoxetine (PAXIL) 20 mg tablet Take 20 mg by mouth every morning.   ? SOLODYN 80 mg Tb24    ? tretinoin (RETIN-A) 0.05 % cream    No current facility-administered medications for this visit.  Allergies:No Known AllergiesFamily History:Family History Problem Relation Age of Onset ? Allergic rhinitis Sister   Social History:He  reports that he has never smoked. He does not have any smokeless tobacco history on file. No history on file for alcohol and drug.Vitals:Wt Readings from Last 1 Encounters: 02/09/20 72.6 kg  Ht Readings from Last 1 Encounters: 02/09/20 6' (1.829 m)  There is no height or weight on file to calculate BSA. BP Readings from Last 1 Encounters: 03/10/19 110/80  Pulse Readings from Last 1 Encounters: 02/09/20 68  Resp Readings from Last 1 Encounters: 09/25/18 17  Temp Readings from Last 1 Encounters: 02/09/20 98.3 ?F (36.8 ?C) (Tympanic)  SpO2 Readings from Last 1 Encounters: 02/09/20 98%  There is no height or weight on file to calculate BMI. Review of Systems:Review of Systems:Review of Systems - History obtained from the patientGeneral ROS: negativePsychological ROS: negativeRespiratory ROS: no cough, shortness of breath, or wheezing Cardiovascular ROS: positive for - chest tightnessGastrointestinal ROS: no abdominal pain, change in bowel habits, or black or bloody stoolsMusculoskeletal ROS: negativeNeurological ROS: no TIA or stroke symptomsThe remainder  of the 12 point review of systems was reviewed with the patient and is negative.OBJECTIVE: Physical Exam:Physical Exam:The physical exam has been deferred due to the COVID-19 pandemic. 	DATA:Diagnostics:I have reviewed the patient's Radiology images with Dr. Sanda Klein, as well as the report(s) below. Echocardiogram: Results for orders placed or performed during the hospital encounter of 06/01/20 Echo 2D Complete w Doppler and CFI if Ind Image Enhancement 3D and or bubbles Result Value Ref Range  Reported Biplane EF% 54 %  Narrative   * Normal left ventricular size and wall thickness with low normal systolic function (EF 54%).* Normal right ventricular cavity size, systolic function and wall motion.* Trace aortic regurgitation.* Thickened mitral valve with mild mitral regurgitation.* Mild tricuspid regurgitation with normal right heart pressures. 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 ?These results should be interpreted in the context of the patient's clinical findings and family history. VARIANTS OF UNCERTAIN SIGNIFICANCE SHOULD NOT BE USED IN CLINICAL DECISION MAKING._______________________________________________________________________________________________?Methodology:  To test this patient for the presence of this variant, a sample of genomic DNA was used to PCR amplify DNA segments containing the variant being tested. The products of this reaction were Sanger sequenced.?THIS TEST WAS DEVELOPED AND ITS PERFORMANCE CHARACTERISTICS DETERMINED BY THE LABORATORY. TESTS DEVELOPED AND USED BY INDIVIDUAL LABORATORIES ARE NOT SUBJECT TO FDA APPROVAL. Single Gene Analysis Electronic Signature  ?This report is electronically signed by: Lanier Ensign on 06/17/20 at 11:06 AMI have reviewed the interpretation and agree with the results.??This report is electronically signed by: Kingsley Plan, MD on 07/15/20 at 6:03 PMI have reviewed the interpretation and agree with the results.   IMPRESSION/PLAN: Andrew Moody is a 24 y.o. male patient who presented to the aortic institute to discuss recent genetic testing results1. 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 AV is trileaflet. SOV 3.7, Asc 2.9, arch 2.4; all normal measurements- No need to CTA/MRA as aorta is normal size- Will refer for genetic counseling- Maintain good B/P control, systolic <130- Planned to see Dr. Parke Simmers for cardiology follow up- Repeat Echo in 2 years2. EF of 55%- Slightly lower than normal EF- Patient will follow up with Dr. Parke Simmers for further evaluation The above images and plans were discussed in full detail with Dr. Sanda Klein  and they are in agreement. The patient expressed verbal understanding of the above plan. Provided aortic institute office number; instructed to call with questions or development of symptomsRachel A. Taquila Leys, MSN, AGACNP-BCNurse Practitioner for Dr. Sanda Klein and Dr. Carlean Jews [facility], [facility] HealthCardiac Surgery Outpatient [facility] [phone] [facility] [address] [place], [place] [address]: [phone]: [phone]) [email or web address]

## 2020-08-23 ENCOUNTER — Encounter: Admit: 2020-08-23 | Payer: PRIVATE HEALTH INSURANCE | Attending: Cardiovascular Disease | Primary: Internal Medicine

## 2020-08-23 ENCOUNTER — Ambulatory Visit: Admit: 2020-08-23 | Payer: BLUE CROSS/BLUE SHIELD | Attending: Cardiovascular Disease | Primary: Internal Medicine

## 2020-08-23 DIAGNOSIS — D693 Immune thrombocytopenic purpura: Secondary | ICD-10-CM

## 2020-08-23 DIAGNOSIS — R0789 Other chest pain: Secondary | ICD-10-CM

## 2020-08-23 DIAGNOSIS — R9431 Abnormal electrocardiogram [ECG] [EKG]: Secondary | ICD-10-CM

## 2020-08-23 DIAGNOSIS — Z8249 Family history of ischemic heart disease and other diseases of the circulatory system: Secondary | ICD-10-CM

## 2020-08-23 NOTE — Progress Notes
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 He can get brief palpitations when he rolls over in bed. However, he can run 4 miles 3-4x/week with difficulty. He smoked socially and briefly in college.Problem list:Patient Active Problem List  Diagnosis Date Noted ? Palpitations 08/23/2020 ? Family history of aortic aneurysm 08/23/2020 ? Abnormal EKG 08/23/2020 ? Allergic rhinitis due to animal hair and dander 09/19/2012     cat, dust mites, trees, grasses, E.plantain, mold. Reviewed dust mite control measures (encase bedding, removal of carpet), pet avoidance and/or removal of pets from bedroom, HEPA filter. Close windows, bathing after outdoors exposure and air conditioning during pollen seasons. Cont Zyrtec 10 mg daily and restart Nasonex 2 s/en daily, Patadayl. He plans to go away for college but wishes to start allergen IT, due to prior severe seasonal sx uncontrolled w/allergy meds, may initiate therapy if he is able to come in regularly for injections and continue the immunotherapy w/ a board certified allergist in the vicinity of his college.  Explained risks, benefits and alternatives of allergen immunotherapy injections and pt understood.This Dx was updated with the IMO Load 09/23/2016 Medications:Current Outpatient Medications Medication Sig Dispense Refill ? cetirizine (ZYRTEC) 10 MG tablet Take 10 mg by mouth daily.   ? PARoxetine (PAXIL) 20 mg tablet Take 20 mg by mouth every morning.   ? zaleplon (SONATA) 10 mg capsule Take 1 capsule (10 mg total) by mouth nightly. 30 capsule 0 No current facility-administered medications for this visit. Allergies:He has No Known Allergies.Other Histories:Medical History  He    has a past medical history of ITP (idiopathic thrombocytopenic purpura). Surgical History  He  has a past surgical history that includes Knee arthroscopy. Social History  He  reports that he has never smoked. He does not have any smokeless tobacco history on file. No history on file for alcohol use and drug use. The following ROS documentation is the transcribed Review of System completed by the patient at intake.Review of Systems Constitutional: Negative for chills, decreased appetite, diaphoresis, fever, malaise/fatigue, weight gain and weight loss. HENT: Negative for congestion, hearing loss, hoarse voice, nosebleeds, sore throat and tinnitus.  Eyes: Negative for blurred vision and double vision. Cardiovascular: Negative for chest pain, claudication, dyspnea on exertion, irregular heartbeat, leg swelling, near-syncope, orthopnea, palpitations, paroxysmal nocturnal dyspnea and syncope. Respiratory: Negative for cough, hemoptysis, shortness of breath, snoring and wheezing.  Endocrine: Negative for polydipsia and polyuria. Hematologic/Lymphatic: Negative for bleeding problem. Skin: Negative for flushing, itching and rash. Musculoskeletal: Negative for back pain, falls, joint pain, myalgias and neck pain. Gastrointestinal: Negative for abdominal pain, constipation, diarrhea, dysphagia, heartburn, melena, nausea and vomiting. Genitourinary: Negative for dysuria, frequency and hematuria. Neurological: Negative for dizziness, headaches, light-headedness and loss of balance. Psychiatric/Behavioral: Negative for depression. The patient does not have insomnia and is not nervous/anxious.  Physical ExamConstitutional:     Appearance: He is well-developed. HENT:    Head: Normocephalic and atraumatic. Eyes:    Conjunctiva/sclera: Conjunctivae normal. Neck:    Vascular: No JVD. Cardiovascular:    Rate and Rhythm: Normal rate and regular rhythm.    Heart sounds: No murmur heard. Pulmonary:    Effort: Pulmonary effort is normal.    Breath sounds: Normal breath sounds. Abdominal:  General: Bowel sounds are normal.    Palpations: Abdomen is soft. Skin:   General: Skin is warm and dry. Neurological:    Mental Status: He is alert. Vital Signs: BP 120/70 (Site: r a, Position: Sitting, Cuff Size: Medium)  - Pulse 65  - Ht 6' (1.829 m)  - Wt 78.4 kg  - SpO2 99%  - BMI 23.44 kg/m?  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 He has had brief nonexertional palpitations. His EF technically was read as low-normal, though this is of uncertain clinical significance.Home BPs 3x/monthRemain activeHe will call me if his palpitations become more significantEcho in 2 yearsI encouraged the patient to call me if they have any questions and more importantly any new symptoms.

## 2020-08-24 DIAGNOSIS — R002 Palpitations: Secondary | ICD-10-CM

## 2020-11-25 ENCOUNTER — Inpatient Hospital Stay: Admit: 2020-11-25 | Discharge: 2020-11-25 | Payer: BLUE CROSS/BLUE SHIELD | Primary: Internal Medicine

## 2020-11-25 DIAGNOSIS — Z20828 Contact with and (suspected) exposure to other viral communicable diseases: Secondary | ICD-10-CM

## 2020-11-25 DIAGNOSIS — Z20822 Contact with and (suspected) exposure to covid-19: Secondary | ICD-10-CM

## 2020-11-26 ENCOUNTER — Ambulatory Visit: Admit: 2020-11-26 | Payer: PRIVATE HEALTH INSURANCE | Primary: Internal Medicine

## 2020-11-26 LAB — COVID-19 CLEARANCE OR FOR PLACEMENT ONLY: BKR SARS-COV-2 RNA (COVID-19) (YH): NOT DETECTED

## 2020-11-29 ENCOUNTER — Inpatient Hospital Stay: Admit: 2020-11-29 | Discharge: 2020-11-29 | Payer: BLUE CROSS/BLUE SHIELD | Primary: Internal Medicine

## 2020-11-29 DIAGNOSIS — Z20822 Contact with and (suspected) exposure to covid-19: Secondary | ICD-10-CM

## 2020-11-29 DIAGNOSIS — Z20828 Contact with and (suspected) exposure to other viral communicable diseases: Secondary | ICD-10-CM

## 2020-11-30 LAB — COVID-19 CLEARANCE OR FOR PLACEMENT ONLY: BKR SARS-COV-2 RNA (COVID-19) (YH): NOT DETECTED

## 2020-12-04 ENCOUNTER — Inpatient Hospital Stay: Admit: 2020-12-04 | Discharge: 2020-12-04 | Payer: BLUE CROSS/BLUE SHIELD | Primary: Internal Medicine

## 2020-12-04 DIAGNOSIS — Z20828 Contact with and (suspected) exposure to other viral communicable diseases: Secondary | ICD-10-CM

## 2020-12-05 DIAGNOSIS — Z20822 Contact with and (suspected) exposure to covid-19: Secondary | ICD-10-CM

## 2020-12-06 LAB — SARS COV-2 (COVID-19) RNA-~~LOC~~ LABS (BH GH LMW YH): BKR SARS-COV-2 RNA (COVID-19) (YH): NOT DETECTED

## 2021-04-18 ENCOUNTER — Inpatient Hospital Stay: Admit: 2021-04-18 | Discharge: 2021-04-18 | Payer: BLUE CROSS/BLUE SHIELD | Primary: Internal Medicine

## 2021-04-18 DIAGNOSIS — Z20828 Contact with and (suspected) exposure to other viral communicable diseases: Secondary | ICD-10-CM

## 2021-04-18 DIAGNOSIS — U071 COVID-19: Secondary | ICD-10-CM

## 2021-04-18 LAB — SARS COV-2 (COVID-19) RNA-~~LOC~~ LABS (BH GH LMW YH): BKR SARS-COV-2 RNA (COVID-19) (YH): POSITIVE — AB

## 2021-07-25 ENCOUNTER — Encounter: Admit: 2021-07-25 | Payer: PRIVATE HEALTH INSURANCE | Attending: Cardiovascular Disease | Primary: Internal Medicine

## 2021-07-25 ENCOUNTER — Inpatient Hospital Stay: Admit: 2021-07-25 | Discharge: 2021-07-25 | Payer: BLUE CROSS/BLUE SHIELD | Primary: Internal Medicine

## 2021-07-25 ENCOUNTER — Ambulatory Visit: Admit: 2021-07-25 | Payer: BLUE CROSS/BLUE SHIELD | Attending: Cardiovascular Disease | Primary: Internal Medicine

## 2021-07-25 DIAGNOSIS — R002 Palpitations: Secondary | ICD-10-CM

## 2021-07-25 DIAGNOSIS — D693 Immune thrombocytopenic purpura: Secondary | ICD-10-CM

## 2021-07-25 DIAGNOSIS — Z8249 Family history of ischemic heart disease and other diseases of the circulatory system: Secondary | ICD-10-CM

## 2021-07-25 NOTE — Progress Notes
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 He has had brief palpitations when he walks to the bathroom in the morning or when he turns over in bed, though feels well when he exercises. Unrelated, he had chest discomfort brief 04/2021 though this has resolved. His father had surgery by Dr Vanita Panda at age 39. 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 He has been active and well. He can get brief palpitations. However, he can run 4 miles 3-4x/week with difficulty. He smoked socially and briefly in college. ?Problem list:Patient Active Problem List  Diagnosis Date Noted ? Palpitations 08/23/2020 ? Family history of aortic aneurysm 08/23/2020 ? Abnormal EKG 08/23/2020 ? Allergic rhinitis due to animal hair and dander 09/19/2012     cat, dust mites, trees, grasses, E.plantain, mold. Reviewed dust mite control measures (encase bedding, removal of carpet), pet avoidance and/or removal of pets from bedroom, HEPA filter. Close windows, bathing after outdoors exposure and air conditioning during pollen seasons. Cont Zyrtec 10 mg daily and restart Nasonex 2 s/en daily, Patadayl. He plans to go away for college but wishes to start allergen IT, due to prior severe seasonal sx uncontrolled w/allergy meds, may initiate therapy if he is able to come in regularly for injections and continue the immunotherapy w/ a board certified allergist in the vicinity of his college.  Explained risks, benefits and alternatives of allergen immunotherapy injections and pt understood.This Dx was updated with the IMO Load 09/23/2016 Medications:Current Outpatient Medications Medication Sig Dispense Refill ? cetirizine (ZYRTEC) 10 MG tablet Take 10 mg by mouth daily.   ? gabapentin (NEURONTIN) 100 mg capsule Take 1 capsule (100 mg total) by mouth nightly. 30 capsule 0 ? zaleplon (SONATA) 5 mg capsule Take 1 capsule (5 mg total) by mouth nightly. 30 capsule 3 ? PARoxetine (PAXIL) 20 mg tablet Take 20 mg by mouth every morning.   No current facility-administered medications for this visit. Allergies:He has No Known Allergies.The following ROS documentation is the transcribed Review of System completed by the patient at intake.Review of Systems Constitutional: Negative for chills, decreased appetite, diaphoresis, fever, malaise/fatigue, weight gain and weight loss. Cardiovascular: Positive for palpitations. Negative for chest pain, claudication, dyspnea on exertion, irregular heartbeat, leg swelling, near-syncope, orthopnea, paroxysmal nocturnal dyspnea and syncope. Respiratory: Negative for cough, hemoptysis, shortness of breath, snoring and wheezing.   Vital Signs: BP 128/82 (Site: r a, Position: Sitting, Cuff Size: Medium)  - Pulse 69  - Ht 6' (1.829 m)  - Wt 79.4 kg  - SpO2 97%  - BMI 23.73 kg/m? Wt Readings from Last 3 Encounters: 07/25/21 79.4 kg 08/23/20 78.4 kg 02/09/20 72.6 kg Physical ExamConstitutional:     Appearance: He is well-developed. HENT:    Head: Normocephalic and atraumatic. Eyes:    Conjunctiva/sclera: Conjunctivae normal. Neck:    Vascular: No JVD. Cardiovascular:    Rate and Rhythm: Normal rate and regular rhythm.    Heart sounds: No murmur heard.Pulmonary:    Effort: Pulmonary effort is normal.    Breath sounds: Normal breath sounds. Abdominal:    General: Bowel sounds are normal.    Palpations: Abdomen is soft. Skin:   General: Skin is warm and dry. Neurological:    Mental Status: He is alert. Labs:CBCLab Results Component Value Date  WBC 6.2 09/24/2018  HGB 15.2 09/24/2018  HCT 43.4 09/24/2018  MCV 89.9  09/24/2018  PLT 192 09/24/2018 Chemistry  Chemistry  Lab Results Component Value Date  NA 142 09/24/2018  K 3.9 09/24/2018  CL 101 09/24/2018  CO2 27 09/24/2018  BUN 12 09/24/2018  CREATININE 0.85 09/24/2018  GLU 151 (H) 09/24/2018  Lab Results Component Value Date  CALCIUM 9.9 09/24/2018  EKG: NSR High voltageResults for orders placed or performed in visit on 07/25/21 EKG (Clinic Performed) Result Value Ref Range  ECG - HEART RATE 56 bpm  ECG - QRS Interval 105 ms  ECG - QT Interval 396 ms  ECG - QTC Interval 382 ms  ECG - P Axis 58 deg  ECG - QRS Axis 68 deg  ECG - T Wave Axis 41 deg  ECG -- P-R Interval 157 msec  ECG - SEVERITY Normal ECG severity ECHO:Results for orders placed or performed during the hospital encounter of 06/01/20 Echo 2D Complete w Doppler and CFI if Ind Image Enhancement 3D and or bubbles Result Value Ref Range  Reported Biplane EF% 54 %  Narrative   * Normal left ventricular size and wall thickness with low normal systolic function (EF 54%).* Normal right ventricular cavity size, systolic function and wall motion.* Trace aortic regurgitation.* Thickened mitral valve with mild mitral regurgitation.* Mild tricuspid regurgitation with normal right heart pressures. Stress Test: No prior known results Assessment25yo WM with palpitations and a FH of aortic aneurysm, has been active and well.Plan1. 7 day monitor, then arrange follow up as neededADDENDUMHe changed his mind and declines cardiac monitoring. He will use an Apple watch instead and report any issues to me directly I encouraged the patient to call me if they have any questions and more importantly any new symptoms.

## 2021-10-20 ENCOUNTER — Encounter: Admit: 2021-10-20 | Payer: PRIVATE HEALTH INSURANCE | Attending: Medical | Primary: Internal Medicine

## 2022-06-05 ENCOUNTER — Inpatient Hospital Stay: Admit: 2022-06-05 | Discharge: 2022-06-05 | Payer: BLUE CROSS/BLUE SHIELD | Primary: Internal Medicine

## 2022-06-05 ENCOUNTER — Encounter: Admit: 2022-06-05 | Payer: PRIVATE HEALTH INSURANCE | Attending: Cardiovascular Disease | Primary: Internal Medicine

## 2022-06-05 ENCOUNTER — Ambulatory Visit: Admit: 2022-06-05 | Payer: BLUE CROSS/BLUE SHIELD | Attending: Cardiovascular Disease | Primary: Internal Medicine

## 2022-06-05 DIAGNOSIS — R002 Palpitations: Secondary | ICD-10-CM

## 2022-06-05 DIAGNOSIS — D693 Immune thrombocytopenic purpura: Secondary | ICD-10-CM

## 2022-06-05 MED ORDER — ESCITALOPRAM 20 MG TABLET
20 mg | ORAL | Status: AC
Start: 2022-06-05 — End: ?

## 2022-06-05 NOTE — Progress Notes
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 Recently, he had a single strong heart beat weeks ago that concerned him. His resting HR is 55-60 though can be 42. He runs 2x/week. His father had surgery by Dr Vanita Panda at age 27. 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  ?Problem list:Patient Active Problem List  Diagnosis Date Noted ? Palpitations 08/23/2020 ? Family history of aortic aneurysm 08/23/2020 ? Abnormal EKG 08/23/2020 ? Allergic rhinitis due to animal hair and dander 09/19/2012     cat, dust mites, trees, grasses, E.plantain, mold. Reviewed dust mite control measures (encase bedding, removal of carpet), pet avoidance and/or removal of pets from bedroom, HEPA filter. Close windows, bathing after outdoors exposure and air conditioning during pollen seasons. Cont Zyrtec 10 mg daily and restart Nasonex 2 s/en daily, Patadayl. He plans to go away for college but wishes to start allergen IT, due to prior severe seasonal sx uncontrolled w/allergy meds, may initiate therapy if he is able to come in regularly for injections and continue the immunotherapy w/ a board certified allergist in the vicinity of his college.  Explained risks, benefits and alternatives of allergen immunotherapy injections and pt understood.This Dx was updated with the IMO Load 09/23/2016 Medications:Current Outpatient Medications Medication Sig Dispense Refill ? cetirizine (ZYRTEC) 10 MG tablet Take 1 tablet (10 mg total) by mouth daily.   ? escitalopram oxalate (LEXAPRO) 20 mg tablet Take by mouth.   ? zaleplon (SONATA) 5 mg capsule Take 1 capsule (5 mg total) by mouth nightly. 30 capsule 5 No current facility-administered medications for this visit. Allergies:He has No Known Allergies.The following ROS documentation is the transcribed Review of System completed by the patient at intake.Review of Systems Constitutional: Negative for chills, decreased appetite, diaphoresis, fever, malaise/fatigue, weight gain and weight loss. Cardiovascular: Positive for palpitations. Negative for chest pain, claudication, dyspnea on exertion, irregular heartbeat, leg swelling, near-syncope, orthopnea, paroxysmal nocturnal dyspnea and syncope. Respiratory: Negative for cough, hemoptysis, shortness of breath, snoring and wheezing.   Vital Signs: BP 114/76 (Site: r a, Position: Sitting, Cuff Size: Medium)  - Pulse 64  - Ht 6' (1.829 m)  - Wt 77.1 kg  - SpO2 98%  - BMI 23.06 kg/m? Wt Readings from Last 3 Encounters: 06/05/22 77.1 kg 07/25/21 79.4 kg 08/23/20 78.4 kg Physical ExamConstitutional:     Appearance: He is well-developed. HENT:    Head: Normocephalic and atraumatic. Eyes:    Conjunctiva/sclera: Conjunctivae normal. Neck:    Vascular: No JVD. Cardiovascular:    Rate and Rhythm: Normal rate and regular rhythm.    Heart sounds: No murmur heard.Pulmonary:    Effort: Pulmonary effort is normal.    Breath sounds: Normal breath sounds. Abdominal:    General: Bowel sounds are normal.    Palpations: Abdomen is soft. Skin:   General: Skin is warm and dry. Neurological:    Mental Status: He is alert. Labs:CBCLab Results Component Value Date  WBC 6.2 09/24/2018  HGB 15.2 09/24/2018  HCT 43.4 09/24/2018  MCV 89.9 09/24/2018  PLT 192 09/24/2018 Chemistry  Chemistry  Lab Results Component Value Date  NA 142 09/24/2018  K 3.9 09/24/2018  CL 101 09/24/2018  CO2 27 09/24/2018  BUN 12 09/24/2018  CREATININE 0.85 09/24/2018  GLU 151 (H) 09/24/2018  Lab Results Component Value  Date  CALCIUM 9.9 09/24/2018  EKG: NSR WNLResults for orders placed or performed in visit on 06/05/22 EKG (Clinic Performed) Result Value Ref Range  Heart Rate 60 bpm  QRS Interval 104 ms  QT Interval 386 ms  QTC Interval 387 ms  P Axis 54 deg  QRS Axis 55 deg  T Wave Axis 25 deg  P-R Interval 155 msec  SEVERITY Normal ECG severity ECHO:Results for orders placed or performed during the hospital encounter of 06/01/20 Echo 2D Complete w Doppler and CFI if Ind Image Enhancement 3D and or bubbles Result Value Ref Range  Reported Biplane EF% 54 %  Narrative   * Normal left ventricular size and wall thickness with low normal systolic function (EF 54%).* Normal right ventricular cavity size, systolic function and wall motion.* Trace aortic regurgitation.* Thickened mitral valve with mild mitral regurgitation.* Mild tricuspid regurgitation with normal right heart pressures. Assessment26yo WM with palpitations and a FH of aortic aneurysm, has been active and well, though had a recent single strong beat which concerns him, and some HR variability on his Apple watch. I do not suspect any worrisome rhythm abnormalities.Plan1. 7 day monitor2. F/u with Dr Vanita Panda I encouraged the patient to call me if they have any questions and more importantly any new symptoms.

## 2022-06-06 ENCOUNTER — Encounter: Admit: 2022-06-06 | Payer: PRIVATE HEALTH INSURANCE | Attending: Cardiothoracic Surgery | Primary: Internal Medicine

## 2022-06-06 DIAGNOSIS — Z8249 Family history of ischemic heart disease and other diseases of the circulatory system: Secondary | ICD-10-CM

## 2022-06-06 DIAGNOSIS — Z148 Genetic carrier of other disease: Secondary | ICD-10-CM

## 2022-06-06 DIAGNOSIS — Z8481 Family history of carrier of genetic disease: Secondary | ICD-10-CM

## 2022-06-07 ENCOUNTER — Telehealth: Admit: 2022-06-07 | Payer: PRIVATE HEALTH INSURANCE | Attending: Cardiothoracic Surgery | Primary: Internal Medicine

## 2022-06-07 NOTE — Telephone Encounter
Called patient to schedule f/u appt with Dr. Selinda Flavin with CTA chest prior at request of Dr. Parke Simmers.  Left message requesting return call with preference of [facility] or [facility] for imaging.

## 2022-06-15 ENCOUNTER — Telehealth: Admit: 2022-06-15 | Payer: PRIVATE HEALTH INSURANCE | Attending: Cardiovascular Disease | Primary: Internal Medicine

## 2022-06-15 NOTE — Telephone Encounter
Monitor is normal. F/u with Dr Vanita Panda

## 2022-07-09 ENCOUNTER — Inpatient Hospital Stay: Admit: 2022-07-09 | Discharge: 2022-07-09 | Payer: BLUE CROSS/BLUE SHIELD | Primary: Internal Medicine

## 2022-07-09 DIAGNOSIS — I719 Aortic aneurysm of unspecified site, without rupture: Secondary | ICD-10-CM

## 2022-07-09 DIAGNOSIS — Z8481 Family history of carrier of genetic disease: Secondary | ICD-10-CM

## 2022-07-09 DIAGNOSIS — Z8249 Family history of ischemic heart disease and other diseases of the circulatory system: Secondary | ICD-10-CM

## 2022-07-09 MED ORDER — IOHEXOL 350 MG IODINE/ML INTRAVENOUS SOLUTION
350 mg iodine/mL | Freq: Once | INTRAVENOUS | Status: CP | PRN
Start: 2022-07-09 — End: ?
  Administered 2022-07-09: 15:00:00 350 mL via INTRAVENOUS

## 2022-07-10 ENCOUNTER — Encounter: Admit: 2022-07-10 | Payer: PRIVATE HEALTH INSURANCE | Attending: Cardiothoracic Surgery | Primary: Internal Medicine

## 2022-07-10 ENCOUNTER — Ambulatory Visit: Admit: 2022-07-10 | Payer: BLUE CROSS/BLUE SHIELD | Attending: Cardiothoracic Surgery | Primary: Internal Medicine

## 2022-07-10 DIAGNOSIS — Z148 Genetic carrier of other disease: Secondary | ICD-10-CM

## 2022-07-10 DIAGNOSIS — I77819 Aortic ectasia, unspecified site: Secondary | ICD-10-CM

## 2022-07-10 DIAGNOSIS — Z8249 Family history of ischemic heart disease and other diseases of the circulatory system: Secondary | ICD-10-CM

## 2022-07-10 DIAGNOSIS — Z8481 Family history of carrier of genetic disease: Secondary | ICD-10-CM

## 2022-07-11 DIAGNOSIS — Z0489 Encounter for examination and observation for other specified reasons: Secondary | ICD-10-CM

## 2022-07-12 NOTE — Progress Notes
[facility]	Cardiac Surgery History & Physical/ConsultVIDEO TELEHEALTH VISIT: This clinician is part of the telehealth program and is conducting this visit in a currently approved location. For this visit the clinician and patient were present via interactive audio & video telecommunications system that permits real-time communications, via the  [employer].Patient's use of the telehealth platform followed consent and acknowledges agreement to permit telehealth for this visit. State patient is located in: [place]The clinician is appropriately licensed in the above state to provide care for this visit. Other individuals present during the telehealth encounter and their role/relation: noneBecause this visit was completed over video, a hands-on physical exam was not performed. Patient/parent or guardian understands and knows to call back if condition changes.History of Present Illness:I had the pleasure of seeing Andrew Moody in my office today on 07/10/2022. Andrew Moody is a 26 y.o. male who presents for surgical consultation. He has a family history of aortic aneurysm. He has a known aortic dilatation. Patient denies chest / back pain, palpitations, shortness of breath at rest or exertion.Recent CTA imaging shows No evidence of aortic dissection or aneurysm.PMH and UJW:JXBJ Medical History: Diagnosis Date ? ITP (idiopathic thrombocytopenic purpura)   2011 Past Surgical History: Procedure Laterality Date ? KNEE ARTHROSCOPY   Review of Systems:Review of Systems:Review of Systems - General ROS: negativePsychological ROS: negativeHematological and Lymphatic ROS: negativeRespiratory ROS: no cough, shortness of breath, or wheezingCardiovascular ROS: no chest pain or dyspnea on exertionGastrointestinal ROS: no abdominal pain, change in bowel habits, or black or bloody stoolsMusculoskeletal ROS: negativeNeurological ROS: no TIA or stroke symptomsThe remainder of the 12 point review of systems was reviewed with the patient and is negative.CTA: 7/17/2023Comparison:  No prior studies are available for comparison.?FINDINGS:HEART AND VESSELS:The heart is normal in size without significant pericardial effusion.  The pulmonary arteries appear unremarkable. ?Aortic measurements are obtained as follows (in millimeters):Sinus of Valsalva: 33 x 37 x 34Sinotubular junction: 29 x ascending aorta: 30 x 29Distal ascending aorta: 29 x 28Aortic arch: 26 x 25Proximal descending aorta: 24 x descending aorta: 21 x 21Diaphragmatic hiatus: 20 x 19?MEDIASTINUM:No significantly enlarged hilar or mediastinal adenopathy is evident. No significant axillary adenopathy is seen. The visualized esophagus and esophagogastric junction have an unremarkable [place] appearance, though evaluation is limited. The thyroid gland appears unremarkable.?PLEURA:There is no pleural effusion on either side.  There is no pleural calcification or mass.  There is no pneumothorax. ?LUNGS AND AIRWAYS:The lungs are clear bilaterally. No pleural or parenchymal nodularity is seen. No endobronchial lesion is identified.  ?The central airways are patent. No endobronchial lesion is seen.?ABDOMEN AND OTHER: Imaged portion of the upper abdomen appears unremarkable.  ?BONES:No worrisome lytic or sclerotic lesions are identified.?IMPRESSION:1.  No evidence of aortic dissection or aneurysm.2. No worrisome lung nodules.Impression/Plan:Andrew Moody is a 26 y.o. male who presents for surgical consultation. He has a family history of aortic aneurysm.Patient denies chest / back pain, palpitations, shortness of breath at rest or exertion.Recent CTA imaging shows No evidence of aortic dissection or aneurysm.He has a known aortic dilatation, that measures about 3.8 cm at the root. His father has a history of aortic aneurysm disease. 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 He just needs surveillance due to his father's history and his mutation. We will see him back in 1 year with a MRI of the chest. Given he will need long term aortic surveillance, I think we should do MRI platform instead of [place] scan in order to minimize radiation exposure. We offered  him genetic counseling, but at this point he wants to hold off. Will see patient on 07/15/22 with MRI chestTotal time spent with patient care, including review of imaging and echo was 45 minutes.Sanda Klein, MD,MSDirector, [facility] Aortic InstituteDirector, [facility] Pulmonary Thromboendarterectomy [facility] of MedicinePO [phone] [place], [place] [address]J [phone] .eduScribed for Sanda Klein, MD by Dorena Bodo, medical scribe July 18, 2023The documentation recorded by the scribe accurately reflects the services I personally performed and the decisions made by me. I reviewed and confirmed all material entered and/or pre-charted by the scribe.

## 2022-07-18 ENCOUNTER — Encounter: Admit: 2022-07-18 | Payer: PRIVATE HEALTH INSURANCE | Attending: Cardiothoracic Surgery | Primary: Internal Medicine

## 2022-07-25 ENCOUNTER — Encounter
Admit: 2022-07-25 | Payer: PRIVATE HEALTH INSURANCE | Attending: Student in an Organized Health Care Education/Training Program | Primary: Internal Medicine

## 2022-07-25 ENCOUNTER — Telehealth: Admit: 2022-07-25 | Payer: PRIVATE HEALTH INSURANCE | Attending: Cardiothoracic Surgery | Primary: Internal Medicine

## 2022-07-25 DIAGNOSIS — Z148 Genetic carrier of other disease: Secondary | ICD-10-CM

## 2022-07-25 DIAGNOSIS — Z8481 Family history of carrier of genetic disease: Secondary | ICD-10-CM

## 2022-07-25 DIAGNOSIS — Z8249 Family history of ischemic heart disease and other diseases of the circulatory system: Secondary | ICD-10-CM

## 2022-07-25 NOTE — Telephone Encounter
Patient calling to relay that he is interested in genetic counseling and would like to obtain more information on how he can proceed.

## 2022-09-21 ENCOUNTER — Encounter: Admit: 2022-09-21 | Payer: PRIVATE HEALTH INSURANCE | Attending: Cardiovascular Disease | Primary: Internal Medicine

## 2022-09-21 ENCOUNTER — Ambulatory Visit: Admit: 2022-09-21 | Payer: BLUE CROSS/BLUE SHIELD | Attending: Cardiovascular Disease | Primary: Internal Medicine

## 2022-09-21 DIAGNOSIS — R002 Palpitations: Secondary | ICD-10-CM

## 2022-09-21 DIAGNOSIS — Z148 Genetic carrier of other disease: Secondary | ICD-10-CM

## 2022-09-21 DIAGNOSIS — I77819 Aortic ectasia, unspecified site: Secondary | ICD-10-CM

## 2022-09-21 DIAGNOSIS — Z8249 Family history of ischemic heart disease and other diseases of the circulatory system: Secondary | ICD-10-CM

## 2022-09-21 DIAGNOSIS — D693 Immune thrombocytopenic purpura: Secondary | ICD-10-CM

## 2022-09-21 NOTE — Progress Notes
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 His father had TAA repair , while his paternal GF had brain aneurysm and abdominal aneurysm. 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 Interestingly, his mom died at age 74 from brain aneurysm rupture. That makes him very uncomfortable. His [place] showed SV diameters that are borderline, but his echo shows abnormal appearance of SV even though the diameter is at 3.8 cm. He has had brain imaging that has excluded aneurysm.Problem list:Patient Active Problem List  Diagnosis Date Noted ? Palpitations 08/23/2020 ? Family history of aortic aneurysm 08/23/2020 ? Abnormal EKG 08/23/2020 ? Allergic rhinitis due to animal hair and dander 09/19/2012     cat, dust mites, trees, grasses, E.plantain, mold. Reviewed dust mite control measures (encase bedding, removal of carpet), pet avoidance and/or removal of pets from bedroom, HEPA filter. Close windows, bathing after outdoors exposure and air conditioning during pollen seasons. Cont Zyrtec 10 mg daily and restart Nasonex 2 s/en daily, Patadayl. He plans to go away for college but wishes to start allergen IT, due to prior severe seasonal sx uncontrolled w/allergy meds, may initiate therapy if he is able to come in regularly for injections and continue the immunotherapy w/ a board certified allergist in the vicinity of his college.  Explained risks, benefits and alternatives of allergen immunotherapy injections and pt understood.This Dx was updated with the IMO Load 09/23/2016 Significant Cardiac History:.Medications:Current Outpatient Medications Medication Sig Dispense Refill ? cetirizine (ZYRTEC) 10 MG tablet Take 1 tablet (10 mg total) by mouth daily.   ? escitalopram oxalate (LEXAPRO) 20 mg tablet Take by mouth.   ? zaleplon (SONATA) 5 mg capsule Take 1 capsule (5 mg total) by mouth nightly. 30 capsule 5 No current facility-administered medications for this visit. Allergies:He has No Known Allergies.Other Histories:Medical History  He    has a past medical history of ITP (idiopathic thrombocytopenic purpura). Surgical History  He  has a past surgical history that includes Knee arthroscopy. Social History  He  reports that he has never smoked. He does not have any smokeless tobacco history on file. He reports current alcohol use. No history on file for drug use. The following ROS documentation is the transcribed Review of System completed by the patient at intake.ROSPhysical ExamConstitutional:     Appearance: Normal appearance. He is well-developed. HENT:    Head: Normocephalic.    Nose: Nose normal.    Mouth/Throat:    Mouth: Mucous membranes are moist. Eyes:    Extraocular Movements: Extraocular movements intact.    Conjunctiva/sclera: Conjunctivae normal.    Pupils: Pupils are equal, round, and reactive to light. Neck:    Thyroid: No thyromegaly.    Vascular: No carotid bruit or JVD.    Trachea: No tracheal deviation. Cardiovascular:    Rate and Rhythm: Normal rate.    Heart sounds: Normal heart sounds. No murmur heard.   No friction rub. No gallop. Pulmonary:    Effort: No respiratory distress.    Breath sounds: No wheezing or rales. Chest:    Chest wall: No tenderness. Abdominal:    General: There is no distension.    Tenderness: There is no abdominal tenderness. There is no guarding or rebound. Musculoskeletal:    Cervical back: Normal range of motion and neck supple.    Comments: Mildly increased hypermobility of shoulder  joints Lymphadenopathy:    Cervical: No cervical adenopathy. Skin:   General: Skin is warm and dry. Neurological:    Mental Status: He is alert. Psychiatric:       Mood and Affect: Mood normal. Vital Signs: BP 121/79 (Site: r a, Position: Sitting, Cuff Size: Medium)  - Pulse (!) 58  - Ht 6' (1.829 m)  - Wt 79.4 kg  - SpO2 100%  - BMI 23.73 kg/m?  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 He has normal HR and BP and his aortic root diameter has remained stable over last 2 years. He, his father and paternal GF have a PRKG1 variant that appears as causal although it is still a VUS. The segregation is perfect as 2 unaffected family members do not carry the variant. 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 At this oint he should avoid heavy weight lifting and HR above his anaerobic threshold. Should he develop BOP above 130/90, he should be treated with beta blocker to slow down aneurysm progression. He need echo and head [place] every 2 and 5 years, respectively. I encouraged the patient to call me if they have any questions and more importantly any new symptoms.

## 2022-09-22 DIAGNOSIS — Z8481 Family history of carrier of genetic disease: Secondary | ICD-10-CM

## 2022-09-22 DIAGNOSIS — J3081 Allergic rhinitis due to animal (cat) (dog) hair and dander: Secondary | ICD-10-CM

## 2023-06-03 ENCOUNTER — Inpatient Hospital Stay: Admit: 2023-06-03 | Discharge: 2023-06-03 | Payer: BLUE CROSS/BLUE SHIELD | Primary: Internal Medicine

## 2023-06-03 DIAGNOSIS — Z8481 Family history of carrier of genetic disease: Secondary | ICD-10-CM

## 2023-06-03 DIAGNOSIS — Z148 Genetic carrier of other disease: Secondary | ICD-10-CM

## 2023-06-03 DIAGNOSIS — I77819 Aortic ectasia, unspecified site: Secondary | ICD-10-CM

## 2023-06-03 DIAGNOSIS — Z8249 Family history of ischemic heart disease and other diseases of the circulatory system: Secondary | ICD-10-CM

## 2023-06-03 MED ORDER — GADOTERATE MEGLUMINE 0.5 MMOL/ML (376.9 MG/ML) INTRAVENOUS SOLUTION
0.5376.9 mmol/mL (376.9 mg/mL) | Freq: Once | INTRAVENOUS | Status: CP | PRN
Start: 2023-06-03 — End: ?
  Administered 2023-06-03: 21:00:00 0.5 mL via INTRAVENOUS

## 2023-07-09 ENCOUNTER — Ambulatory Visit: Admit: 2023-07-09 | Payer: BLUE CROSS/BLUE SHIELD | Attending: Cardiothoracic Surgery | Primary: Internal Medicine

## 2023-07-09 DIAGNOSIS — I77819 Aortic ectasia, unspecified site: Secondary | ICD-10-CM

## 2023-07-09 NOTE — Progress Notes
[facility] Institute Surveillance Clinic TELEPHONE VISIT: For this visit the clinician and patient were present via telephone (audio only).Patient counseled on available options for visit type; Patient elected telephone visit (audio only); Patient consent given for telephone (audio only) visit : YesState patient is located in: CTThe clinician is appropriately licensed in the above state to provide care for this visitPatient Identity was confirmed during this call.  Other individuals actively participating in the telephone encounter and their name/relation to the patient: noneTotal time spent in medical telephone (audio only) discussion: 15 minutesBecause this visit was completed over telephone, a hands-on physical exam was not performed.  Patient understands and knows to call back if condition changes.CHART COPY DO NOT DISCARDReferring Physician: Referring, NoNo address on fileChief Complaint:Routine Aortic/Valve SurveillanceSUBJECTIVE: History of Present Illness:I had the pleasure of seeing Andrew Moody in the clinic today on 07/09/2023. Andrew Moody is a 27 y.o. male who presents for aortic surveillance. Today, the patient is in him usual state of health with no complaints. He has a known PRKG mutationNo diagnosis found.Problem List:Past Medical History: Diagnosis Date  ITP (idiopathic thrombocytopenic purpura)   2011 Past Surgical History: Procedure Laterality Date  KNEE ARTHROSCOPY   Medications:Current Outpatient Medications Medication Sig Dispense Refill  cetirizine (ZYRTEC) 10 MG tablet Take 1 tablet (10 mg total) by mouth daily.    escitalopram oxalate (LEXAPRO) 20 mg tablet Take by mouth.    zaleplon (SONATA) 5 mg capsule Take 1 capsule (5 mg total) by mouth nightly. 30 capsule 5 No current facility-administered medications for this visit. Allergies:No Known AllergiesFamily History:Family History Problem Relation Age of Onset  Allergic rhinitis Sister   Social History:He  reports that he has never smoked. He does not have any smokeless tobacco history on file. He reports current alcohol use. No history on file for drug use.Vitals:Wt Readings from Last 1 Encounters: 09/21/22 79.4 kg  Ht Readings from Last 1 Encounters: 09/21/22 6' (1.829 m)  There is no height or weight on file to calculate BSA. BP Readings from Last 1 Encounters: 09/21/22 121/79  Pulse Readings from Last 1 Encounters: 09/21/22 (!) 58  Resp Readings from Last 1 Encounters: 09/25/18 17  Temp Readings from Last 1 Encounters: 02/09/20 98.3 ?F (36.8 ?C) (Tympanic)  SpO2 Readings from Last 1 Encounters: 09/21/22 100%  There is no height or weight on file to calculate BMI. Review of Systems:Review of Systems:Review of Systems - General ROS: negativePsychological ROS: negativeAllergy and Immunology ROS: negativeRespiratory ROS: no cough, shortness of breath, or wheezingCardiovascular ROS: no chest pain or dyspnea on exertionGastrointestinal ROS: no abdominal pain, change in bowel habits, or black or bloody stoolsMusculoskeletal ROS: negativeNeurological ROS: no TIA or stroke symptomsThe remainder of the 12 point review of systems was reviewed with the patient and is negative.OBJECTIVE: 	DATA:Diagnostics:I have reviewed the patient's Radiology images personally, as well as the report(s) within the last 24 hrs. Significant findings are as dictated by the radiologist. MRA Chest: 6/10/2024Comparison: No prior studies are available for comparison. Findings: Heart is normal in size without pericardial effusion. Ascending aorta is normal in caliber measuring 30 mm. There is no aneurysm of the arch or descending thoracic aorta. There is no evidence of intramural hematoma. Branch vessels appear unremarkable. There is no mediastinal or hilar adenopathy. There is no pleural effusion. A 1.5 cm cyst is seen in the left lobe of liver. No discrete bone lesion is identified. Impression:No evidence of thoracic aortic aneurysmMRA 6/10/24Root about 36mm though border is obscured view on MRAAscending unchanged  at about Chest 7/17/2023Aortic root aorta 29.33mmIMPRESSION/PLAN: Andrew Moody is a 27 y.o. male patient who presented to the aortic institute for routine aortic surveillance. On the MRA the aortic root and the ascending aorta dimensions are completely stable (please see below). We can continue with aortic surveillanceWe will see him in 1 year with MRA Jeoffrey Massed, MD,MSDirector, [facility] Aortic InstituteDirector, [facility] Pulmonary Thromboendarterectomy [facility] of MedicinePO [phone] [place], [place] [address]U [phone] .eduScribed for Sanda Klein, MD by Dorena Bodo, medical scribe July 16, 2024The documentation recorded by the scribe accurately reflects the services I personally performed and the decisions made by me. I reviewed and confirmed all material entered and/or pre-charted by the scribe.

## 2024-03-30 ENCOUNTER — Encounter
Admit: 2024-03-30 | Payer: PRIVATE HEALTH INSURANCE | Attending: Student in an Organized Health Care Education/Training Program | Primary: Internal Medicine

## 2024-03-30 ENCOUNTER — Telehealth: Admit: 2024-03-30 | Payer: PRIVATE HEALTH INSURANCE | Attending: Cardiothoracic Surgery | Primary: Internal Medicine

## 2024-03-30 DIAGNOSIS — Z8249 Family history of ischemic heart disease and other diseases of the circulatory system: Secondary | ICD-10-CM

## 2024-03-30 DIAGNOSIS — I77819 Aortic ectasia, unspecified site: Secondary | ICD-10-CM

## 2024-03-30 DIAGNOSIS — Z148 Genetic carrier of other disease: Secondary | ICD-10-CM

## 2024-03-30 NOTE — Telephone Encounter
 Patient called to schedule his 1 year surveillance imaging and appt.  Patient last seen in July 2024; per last office visit note, needs 1 year return with MRA.    Patient aware once order gets placed, Amairani will message MRI dept and MRI will contact patient directly to schedule.  Once done patient will call our office to schedule surveillance with Michela.

## 2024-04-20 ENCOUNTER — Encounter: Admit: 2024-04-20 | Payer: PRIVATE HEALTH INSURANCE | Primary: Internal Medicine

## 2024-06-09 ENCOUNTER — Inpatient Hospital Stay: Admit: 2024-06-09 | Discharge: 2024-06-09 | Payer: PRIVATE HEALTH INSURANCE | Primary: Internal Medicine

## 2024-06-09 DIAGNOSIS — I77819 Aortic ectasia, unspecified site: Secondary | ICD-10-CM

## 2024-06-09 DIAGNOSIS — Z8249 Family history of ischemic heart disease and other diseases of the circulatory system: Secondary | ICD-10-CM

## 2024-06-09 DIAGNOSIS — Z148 Genetic carrier of other disease: Secondary | ICD-10-CM

## 2024-06-09 MED ORDER — GADOTERATE MEGLUMINE 0.5 MMOL/ML (376.9 MG/ML) INTRAVENOUS SOLUTION
0.5 | Freq: Once | INTRAVENOUS | Status: CP | PRN
Start: 2024-06-09 — End: ?
  Administered 2024-06-09: 19:00:00 0.5 mL via INTRAVENOUS

## 2024-06-23 ENCOUNTER — Ambulatory Visit
Admit: 2024-06-23 | Payer: BLUE CROSS/BLUE SHIELD | Attending: Student in an Organized Health Care Education/Training Program | Primary: Internal Medicine

## 2024-06-23 DIAGNOSIS — I77819 Aortic ectasia, unspecified site: Secondary | ICD-10-CM

## 2024-06-23 DIAGNOSIS — Z8249 Family history of ischemic heart disease and other diseases of the circulatory system: Principal | ICD-10-CM

## 2024-06-23 DIAGNOSIS — Z136 Encounter for screening for cardiovascular disorders: Secondary | ICD-10-CM

## 2024-06-23 NOTE — Progress Notes
 TELEPHONE VISIT: For this visit the clinician and patient were present via telephone (audio only).Patient counseled on available options for visit type; Patient elected telephone visit (audio only); Patient consent given for telephone (audio only) visit : YesState patient is located in: CTThe clinician is appropriately licensed in the above state to provide care for this visitPatient Identity was confirmed during this call.  Other individuals actively participating in the telephone encounter and their name/relation to the patient: noneGreater than 10 minutes of time was spent with this patient in medical discussion to support the telephone (audio only) visit: Yes The level of service was determined by medical decision making (MDM).Because this visit was completed over telephone, a hands-on physical exam was not performed.  Patient understands and knows to call back if condition changes.	 [facility]	Aortic Institute Surveillance Clinic CHART COPY DO NOT DISCARDReferring Physician: Referral, SelfNo address on fileChief Complaint:Routine Aortic/Valve SurveillanceSUBJECTIVE: History of Present Illness:I had the pleasure of seeing Andrew Moody in the clinic today on 06/23/2024. Andrew Moody is a 28 y.o. male  who presents for thoracic aorta surveillance given known PRKG1 mutation and family history of TAA, cerebral aneurysms and PRKG1 mutation. Prior imaging with normal size TAA.The patient is without complaint. Denies chest pain, back pain, abdominal pain, peripheral edema, palpitations, SOB, DOE, dizziness, or excess fatigue.Blood pressure is well controlled.Previously saw Dr. Christopher given genetic and familial aspect of aortic disease.Problem List:Past Medical History[1]Past Surgical History[2]Medications:Current Medications[3]Allergies:Allergies[4]Family History:Family History Problem Relation Age of Onset Allergic rhinitis Sister   Father with TAA s/p repairPGF had cerebral and abdominal aneurysmPatient's father and PGF wth PRKG1 mutationMother with cerebral aneurysm rupture resulting in death, no TAA Social History:He  reports that he has never smoked. He does not have any smokeless tobacco history on file. He reports current alcohol use. No history on file for drug use.Vitals:Wt Readings from Last 1 Encounters: 09/21/22 79.4 kg  Ht Readings from Last 1 Encounters: 09/21/22 6' (1.829 m)  There is no height or weight on file to calculate BSA. BP Readings from Last 1 Encounters: 09/21/22 121/79  Pulse Readings from Last 1 Encounters: 09/21/22 (!) 58  Resp Readings from Last 1 Encounters: 09/25/18 17  Temp Readings from Last 1 Encounters: 02/09/20 98.3 ?F (36.8 ?C) (Tympanic)  SpO2 Readings from Last 1 Encounters: 09/21/22 100%  There is no height or weight on file to calculate BMI. Review of Systems:Review of Systems:Review of Systems - General ROS: negativePsychological ROS: negativeAllergy and Immunology ROS: negativeRespiratory ROS: no cough, shortness of breath, or wheezingCardiovascular ROS: no chest pain or dyspnea on exertionGastrointestinal ROS: no abdominal pain, change in bowel habits, or black or bloody stoolsMusculoskeletal ROS: negativeNeurological ROS: no TIA or stroke symptomsThe remainder of the 12 point review of systems was reviewed with the patient and is negative.OBJECTIVE: Physical Exam:Physical Exam: deferred 2/2 telemedicineDATA:Diagnostics:I have reviewed the patient's Radiology images personally, as well as the report(s) within the last 24 hrs. Significant findings are as dictated by the radiologist. Prior TTE reviwed by Dr. LULLA in consultationMRA chest done recently reviewedNo increase in ascending aortic size or root size, thoracic aorta is normal in structure as well, no tortuosity or dissection appreciated Genetic testing results previously reviewed: PRGK1 VUSIMPRESSION/PLAN: Andrew Moody is a 29 y.o. male patient with PMH significant for family history of thoracic aortic aneurysms, cerebral aneurysms, paternal PRKG1 mutation , who presented to the aortic institute for routine aortic surveillance. His aorta is normal in appearance and without known cerebral aneurysms. - Plan to continue surveillance with  echo and head imaging in 2 years (patient preference and this is reasonable). Surveillance is being done given genetic mutation and family history, however patient is young and surveillance interval of 2-5 years is appropriate. Imaging, assessment and plan reviewed and discussed at length with Dr. Thayer Maw. I would like to thank you for entrusting me with his care.  Please do not hesitate to contact me if there is any other way in which I may be of assistance. Patient agreeable with care plan. All questions and concerns answered and addressed.Andrew Moody, MPH, PA-CAortic [facility]: [phone]: [phone]: [email or web address] [1] Past Medical History:Diagnosis Date  ITP (idiopathic thrombocytopenic purpura)   2011 [2] Past Surgical History:Procedure Laterality Date  KNEE ARTHROSCOPY   [3] Current Outpatient Medications Medication Sig Dispense Refill  cetirizine (ZYRTEC) 10 MG tablet Take 1 tablet (10 mg total) by mouth daily.    escitalopram  oxalate (LEXAPRO ) 20 mg tablet Take by mouth.    zaleplon (SONATA) 5 mg capsule Take 1 capsule (5 mg total) by mouth nightly. 30 capsule 5 No current facility-administered medications for this visit. [4] No Known Allergies
